# Patient Record
Sex: Female | Born: 1955 | Race: White | Hispanic: No | Marital: Married
Health system: Midwestern US, Academic
[De-identification: ages and names within clinical notes are randomized; demographics above are authoritative.]

---

## 2004-12-18 NOTE — Unmapped (Signed)
Signed by Georgeanna Harrison Mock on 12/18/2004 at 08:51:24    Phone Note   Call from Patient  Caller: patient  Department: RADIOLOGY  Call for: XRAY     Summary of Call:  LW [id number]    NEED XRAY'S PRINTED FROM 3.14.06 (LT KNEE) AND 12.8.06 (LUMBAR SPINE)   WILL PICK UP 7:30AM ON FRIDAY 7.21.06     Initial call taken by: Anders Simmonds,  December 18, 2004 8:51 AM    This department is not currently using the EMR system. Please see the paper chart for this patient for the response to this phone message.

## 2008-04-20 NOTE — Unmapped (Signed)
Signed by Roanna Raider MA on 04/20/2008 at 10:03:43    Printed Handout:  - Directions to [facility]

## 2008-04-20 NOTE — Unmapped (Signed)
Signed by Roanna Raider MA on 04/20/2008 at 10:03:43    Printed Handout:  - Clear Liquids Allowed the Day Before Surgery

## 2008-04-20 NOTE — Unmapped (Signed)
Signed by Roanna Raider MA on 04/20/2008 at 10:03:43    Printed Handout:  - HalfLytely Bowel Prep Kit

## 2008-04-20 NOTE — Unmapped (Signed)
Signed by Lurene Shadow MD on 04/20/2008 at 00:00:00  Referral      Imported By: Betsey Amen 05/07/2008 11:00:39    _____________________________________________________________________    External Attachment:    Please see [facility] EMR for this document.

## 2008-04-20 NOTE — Unmapped (Signed)
Signed by Roanna Raider MA on 04/20/2008 at 10:03:43    Phone Note   Patient Call    Call back at Home Phone: [phone]  Caller: patient  Department: IM -Gastroenterology  Call for: Pryor Montes    Follow-up for Phone Call   needs screening colo wants a monday.  NKDA, no latex allergy, on no blood thinners, BP or diabetes meds.  Will mail all instructions and RX.  Verbally reviewed instructions, and need for driver  Phone Call Completed, Called Patient  Follow-up by: Roanna Raider MA,  April 20, 2008 10:01 AM      Reason for Call: speak with nurse    Summary of Call:  pt needing colonoscopy.       Initial call taken by: Schuyler Amor,  April 20, 2008 9:33 AM

## 2008-04-20 NOTE — Unmapped (Signed)
Signed by Roanna Raider MA on 04/20/2008 at 10:07:43    [facility]  Division of Digestive Diseases        SURGERY / PROCEDURE SCHEDULE SHEET     Requested Date: 05/08/2008    Arrival Time: 6:30 am    Requested Time: 7:30 am    Length of Surgery: 30 minutes      Physician: Lurene Shadow MD    Facility: [facility]    Type of patient: Outside Referral    Anesthesia Type: IV Sedation    Medications:   Allergies: Allergies have not been documented for this patient    * Latex Sensitive: No    Procedure:     Procedure: Colonoscopy - Screening    Diagnoses: V76.51      Special Instructions: Halflytely bowel prep and clear liquids day prior, Instructions and Rx mailed to the patient.  Verbally reviewed allergy's NKDA, and on no blood thinners, BP or Diabetes meds    Patient Information:     Name: Theresa English    DOB: 02-Apr-1956    SSN: [id number]    Address: [address]  [place], [place]  [address]    Gender: Female    Home phone: [phone]    IDX #: [id number]    Last Word #: [id number]    Insurance Information:     Primary Insurance: Gaylyn Rong    Member ID #: [id number]

## 2008-04-21 NOTE — Unmapped (Signed)
Signed by Roanna Raider MA on 04/24/2008 at 16:02:22    PHONE NOTE - Outgoing Call      Call placed by: Roanna Raider MA,  April 21, 2008 2:54 PM  Call placed to: Patient  Summary of call: patient left message on VM needs to move 12/7 appt.  Called patient and had to leave VM to call us back      FOLLOW UP  Patient returning call.  Please call [phone]  Follow-up by: Graylon Gunning,  April 24, 2008 9:02 AM    ADDITIONAL FOLLOW UP  patient wants 12/10 now instead of 12/7, arrival time is now 730 for 830 procedure, patient received all the info in the mail today  phone call completed, patient called  Follow-up by: Roanna Raider MA,  April 24, 2008 4:00 PM

## 2008-05-08 ENCOUNTER — Inpatient Hospital Stay

## 2008-05-11 ENCOUNTER — Inpatient Hospital Stay

## 2008-05-11 NOTE — Unmapped (Signed)
Signed by Lurene Shadow MD on 05/11/2008 at 00:00:00  Colonoscopy      Imported By: Coletta Memos 05/22/2008 11:01:05    _____________________________________________________________________    External Attachment:    Please see [facility] EMR for this document.

## 2008-05-11 NOTE — Unmapped (Signed)
Signed by Lurene Shadow MD on 05/11/2008 at 00:00:00  Colon Photo      Imported By: Coletta Memos 05/22/2008 11:58:33    _____________________________________________________________________    External Attachment:    Please see [facility] EMR for this document.

## 2008-05-11 NOTE — Unmapped (Signed)
[facility]     PATIENT NAME:   Theresa English, Theresa English                   MR #:  [id number]   DATE OF BIRTH:  December 13, 1955                        ACCOUNT #:  [id number]   PHYSICIAN:      Lurene Shadow, M.D.          ROOM #:   SERVICE:        Internal Medicine/Digestive Disease NURSING UNIT:   DICTATED BY:    Lurene Shadow, M.D.          [facility]:  S   PROCEDURE DATE: 05/11/2008                        ADMIT DATE:  05/11/2008                                                     DISCHARGE DATE:                                       ENDOSCOPY       PROCEDURE(S) PERFORMED:     1.  Colonoscopy.     INDICATION:  Average-risk screening.     ANESTHESIA:  Sedation was administered using 8.5 mg of IV midazolam and 200   mcg of IV fentanyl.     DETAILS OF PROCEDURE:  A pediatric colonoscope was advanced from the anus as   far as the terminal ileum.  The procedure was quite difficult due to the   significant tortuosity of the patient's colon as well as her relative   tolerance for our IV sedatives.  However, we did complete the examination.   The bowel preparation was excellent.  There is no evidence of colitis,   ileitis, diverticulosis, neoplasia or vascular lesions.     IMPRESSION:     1.  Normal screening colonoscopy.  Would repeat in 10 years.                                                 _______________________________________   NS/kbc                                 _____   D:  05/11/2008 10:08                   Lurene Shadow, M.D.   T:  05/11/2008 17:11   Job #:  [id number]     c:   Coralee North, M.D.                                       ENDOSCOPY  Page    1 of   1   NS/kbc                                 _____   D:  05/11/2008 10:08                   Lurene Shadow, M.D.   T:  05/11/2008 17:11   Job #:  [id number]     c:   Coralee North, M.D.                                       ENDOSCOPY                                         Page    1 of   1

## 2008-05-11 NOTE — Unmapped (Signed)
Signed by   [employer] on 05/11/2008 at 17:26:11  Patient: [name]  Note: All result statuses are Final unless otherwise noted.    Tests: (1)  (MR)    Order Note:                             [facility]     PATIENT NAME:   Theresa English, Theresa English                   MR #:  [id number]  DATE OF BIRTH:  December 14, 1955                        ACCOUNT #:  [id number]  PHYSICIAN:      Lurene Shadow, M.D.          ROOM #:  SERVICE:        Internal Medicine/Digestive Disease NURSING UNIT:  DICTATED BY:    Lurene Shadow, M.D.          [facility]:  S  PROCEDURE DATE: 05/11/2008                        ADMIT DATE:  05/11/2008                                                    DISCHARGE DATE:                                       ENDOSCOPY        PROCEDURE(S) PERFORMED:     1.  Colonoscopy.     INDICATION:  Average-risk screening.     ANESTHESIA:  Sedation was administered using 8.5 mg of IV midazolam and 200  mcg of IV fentanyl.     DETAILS OF PROCEDURE:  A pediatric colonoscope was advanced from the anus as  far as the terminal ileum.  The procedure was quite difficult due to the  significant tortuosity of the patient's colon as well as her relative  tolerance for our IV sedatives.  However, we did complete the examination.  The bowel preparation was excellent.  There is no evidence of colitis,  ileitis, diverticulosis, neoplasia or vascular lesions.     IMPRESSION:     1.  Normal screening colonoscopy.  Would repeat in 10 years.                                                    _______________________________________  NS/kbc                                 _____  D:  05/11/2008 10:08                   Lurene Shadow, M.D.  T:  05/11/2008 17:11  Job #:  [id number]     c:   Coralee North, M.D.  ENDOSCOPY                                                               Page    1 of   1    Note: An exclamation mark (!) indicates a result that was not dispersed into   the  flowsheet.  Document Creation Date: 05/11/2008 5:26 PM  _______________________________________________________________________    (1) Order result status: Final  Collection or observation date-time: 05/11/2008 00:00  Requested date-time:   Receipt date-time:   Reported date-time:   Referring Physician:    Ordering Physician:  Reviewed In [facility])  Specimen Source:   Source: DBS  Filler Order Number: [id number]  Lab site:

## 2014-04-17 ENCOUNTER — Ambulatory Visit: Admit: 2014-04-17 | Payer: PRIVATE HEALTH INSURANCE

## 2014-04-17 DIAGNOSIS — G8929 Other chronic pain: Secondary | ICD-10-CM

## 2014-04-17 MED ORDER — meloxicam (MOBIC) 7.5 MG tablet
7.5 | ORAL_TABLET | Freq: Two times a day (BID) | ORAL | Status: AC | PRN
Start: 2014-04-17 — End: ?

## 2014-04-17 MED ORDER — tiZANidine (ZANAFLEX) 2 MG tablet
2 | ORAL_TABLET | Freq: Three times a day (TID) | ORAL | Status: AC | PRN
Start: 2014-04-17 — End: ?

## 2014-04-17 MED ORDER — DULoxetine (CYMBALTA) 60 MG capsule
60 | ORAL_CAPSULE | Freq: Every day | ORAL | Status: AC
Start: 2014-04-17 — End: ?

## 2014-04-17 NOTE — Unmapped (Signed)
Chief Complaint   Patient presents with   ??? Back Pain   ??? Hip Pain     right worse than left        History of Present Illness  HPI  Theresa English is a 58 y.o. female is referred here by Dr. Margaretmary Dys  for consultation, evaluation and treatment for both hip and back pain.  Patient states she's been dealing back pain for 10 years. She has been dealing hip pain for 9-10 years leading to replacement of the right hip 2008 and left hip in 2009. Her symptom were resolved after getting her hips replaced and started up again 3 years ago but has gotten significantly worse over the past year.   She plans to see an orthopod in the next week regarding hip pain.     Patient complains of hip pain bilateral R>L 4-9/10 VAS achy intermittent with fluctuating flare up of symptoms with radiation into the groin. Zanaflex improves symptoms. Activity makes symptoms worst.     Low back pain bilateral sharp intermittent 4-9/10 VAS non-referred. Rest and muscle relaxer improve symptoms. Post-exercise make symptoms worse.    Current medication: cymbalta 60mg  Qday, zanaflex 4mg   3 steroid injections this year  Past therapy: L5-S1 bilateral TFESI, trigger point injections with the last one occuring 2-3 months ago 3 total steroid injections this year, medial branch blocks, and radiofrequency ablation.      Zanaflex causes sedation.     Patient Complains of:  Uro-fecal Incontinence:   No  Weight Gain/Loss:  No  Fever/Chills:   No  Weakness:   No    Past Modalities:  TENS:    Yes  Physical Therapy:  Yes earlier this year  Chiropractor:   Yes  Massage Therapy:  Yes  Psychotherapy:  No    Previous Injection (%): see above  Effect of Injection (%):    Length of Relief:      Past Medications:  Celebrex, tramadol,hydrocodone     The following portions of the patient's history were reviewed and updated as appropriate: allergies, current medications, past family history, past medical history, past social history, past surgical history and  problem list.    Histories  She has a past medical history of Hypertension; MVP (mitral valve prolapse); and Raynaud disease.    She has past surgical history that includes Total hip arthroplasty.    Her family history includes Cancer in her mother and sister; Coronary artery disease in her mother; Diabetes in her sister.    She reports that she has never smoked. She does not have any smokeless tobacco history on file. She reports that she drinks alcohol. She reports that she does not use illicit drugs.    Allergies  Review of patient's allergies indicates no known allergies.    Medications  Outpatient Encounter Prescriptions as of 04/17/2014   Medication Sig Dispense Refill   ??? DULoxetine (CYMBALTA) 60 MG capsule Take 1 capsule (60 mg total) by mouth daily. 30 capsule 0   ??? LORazepam (ATIVAN) 0.5 MG tablet Take 0.5 mg by mouth 2 times a day as needed for Anxiety.     ??? metoprolol tartrate (LOPRESSOR) 50 MG tablet Take 50 mg by mouth daily.     ??? tiZANidine (ZANAFLEX) 4 MG capsule Take 4 mg by mouth 3 times a day.     ??? zolpidem (AMBIEN CR) 6.25 MG Take 6.25 mg by mouth at bedtime as needed for Sleep.     ??? [DISCONTINUED] DULoxetine (  CYMBALTA) 60 MG capsule Take 60 mg by mouth daily.     ??? meloxicam (MOBIC) 7.5 MG tablet Take 1 tablet (7.5 mg total) by mouth 2 times a day as needed for Pain. Indications: Osteoarthritis 60 tablet 0   ??? tiZANidine (ZANAFLEX) 2 MG tablet Take 0.5-1 tablets (1-2 mg total) by mouth every 8 hours as needed. 90 tablet 0     No facility-administered encounter medications on file as of 04/17/2014.        Review of Systems   Musculoskeletal: Positive for back pain and arthralgias.   All other systems reviewed and are negative.      Vitals  Blood pressure 143/83, pulse 66, temperature 97.1 ??F (36.2 ??C), temperature source Oral, resp. rate 14, height 4' 8 (1.422 m), weight 139 lb (63.05 kg).    Physical Exam   Vitals reviewed.  Constitutional: She is oriented to person, place, and time. She  appears well-developed and well-nourished.   HENT:   Head: Normocephalic.   Eyes: EOM are normal.   Cardiovascular: Regular rhythm.    Pulmonary/Chest: Breath sounds normal. No respiratory distress. She has no wheezes. She has no rales.   Abdominal: Soft. She exhibits no distension. There is no tenderness. There is no rebound and no guarding.   Musculoskeletal:        Right hip: She exhibits tenderness. She exhibits normal range of motion and normal strength.        Left hip: She exhibits tenderness. She exhibits normal range of motion and normal strength.        Lumbar back: She exhibits tenderness, bony tenderness, pain and spasm. She exhibits normal range of motion.        Back:    Negative patrick's sign, negative facet loading, negative modified straight leg test bilaterally,  Pain with abduction of the right hip  5/5 strength of the bilateral lower extremities   Neurological: She is alert and oriented to person, place, and time. No cranial nerve deficit.   Skin: Skin is warm and dry.   Psychiatric: She has a normal mood and affect. Her behavior is normal. Judgment and thought content normal.      Neurologic Exam     Mental Status   Oriented to person, place, and time.     Cranial Nerves     CN III, IV, VI   Extraocular motions are normal.      Ortho Exam    Activities of Daily Living:  1. Activity Level - patient indicates as   and performs independently.  2. Sleep Patterns - patient indicates as   and performs independently.                                 - indicates an average of   hours of sleep per night.  3. Motivation - patient indicates as   and performs independently.  4. Functional Level - patient indicates as   and performs independently.           (a) Bathing - patient indicates as   and performs independently.           (b) Dressing - patient indicates as   and performs independently.           (c) Transferring from bed/chair - patient indicates as   and performs independently.           (d)  Walking - patient  indicates as   and performs independently.           (e) Eating - patient indicates as   and performs independently.           (f) Toilet Use - patient indicates as   and performs independently.           (g) Personal Hygiene - patient indicates as   and performs independently.  Notes:      Review of Lab Results  No results found for: WBC, RBC, HGB, HCT, MCV, MCH, MCHC, RDW, PLT, MPV, MG, BUN, INR, PTT    Investigations Reviewed:   05/17/13 MRI lumbar: Left lateral disc extruded fragment effaced the left S1 nerve root and to a lesser degress the left L5 nerve root at the level of L5/S1    Disc bulge with right lateral component effaced exiting right L4 nerve root and right greater than left L5 nerve root at the level of L4/L5.    Rx Reporting Review:  Date of last UDS:  04/17/2014   Comments:  pending  Date of last OARRS review: 04/17/2014   Comments: consistent     Pain Flowsheet (Urine Drug/OARRS-eKASPER/Narcotic) 04/17/2014 04/17/2014   OARRS/eKASPER Consistent with Prescriber Expectation Y Y   OARRS/eKASPER Status Reviewed Reviewed     I have completed the required OARRS documentation for this patient on 04/17/2014.  ARION ALEXANDER SMALLEY    Chronic Assessment Tools:  PEG Total Score: 25  ORT Total Score (FEMALE): 1  ORT Risk Category (Female): Low  SOAPP Total Score: 3  SOAPP Indication: Negative     ASSESSMENT:  1. Myofacial pain   2. Bilateral chronic hip pain  3. Lumbago without radiculopathy  4. Lumbar DDD    PLAN:  1. Discussed with the patient regarding the etiology of their pain. Informed them that they would likely benefit from a bilateral trigger point injections of the paraspinous muscles at L3, L4, L5. The procedure was described in detail and the risks, benefits and alternatives were discussed with the patient (including but not limited to: bleeding, infection, nerve damage, worsening of pain, inability to perform injection, paralysis, seizures, and death) who agreed to proceed.     2. Continue cymbalta 60mg  Qday (#30, 0 refills).   3. Decrease Zanaflex from 4 to 2 mg (#90, 0 refills).   4. Aquatic therapy for hips and low back pain  5. Start Mobic 7.5mg  BID prn pain (#60, 0 refills). Patient informed of increased risk of heart attacks, stroke and kidney problems in addition to gastric ulcers with use of non-steroidal anti-inflammatory medications.   6. Please be careful driving until you know how the medication changes affect you.   7. Follow up 4 weeks    Domenic Polite MD  Pain Fellow         ATTENDING NOTE:    I have supervised the fellow in this assessment, reviewed their note and discussed care with them.  I personally saw the patient and I agree with their history, physical examination, assessment and plan as noted above.     Theresa English is a 58 y.o. female is referred here by Dr. Margaretmary Dys for consultation, evaluation and treatment for the chief complaint of both hip and low back pain.     Discussed with the patient that they would likely benefit from a multi-modal pain medication treatment plan focusing on function and pain relief.    Referral to aquatic therapy for the hip and low  back pain.    Discussed with the patient regarding the etiology of their pain. Informed them that they would likely benefit from a bilateral trigger point injections into the bilateral lumbar paraspinal muscles at L3, L4 and L5.  The procedure was described in detail and the risks, benefits and alternatives were discussed with the patient (including but not limited to: bleeding, infection, nerve damage, worsening of pain, CSF leak, inability to perform injection, paralysis, seizures, and death) who agreed to proceed.   Procedure performed today for the convenience of the patient so that they wouldn't have to return for the injection at a later date as they wished to have the procedure done ASAP due to their pain.     Will start Mobic 7.5 mg by mouth two times per day as needed for pain with food  for inflammatory component of pain.   Will decrease Tizanidine to 2 mg for myofascial pain as needed.  Patient instructed regarding side effects and the need to avoid driving until they know how the medication changes affect them.   Will continue Cymbalta at this time.      Patient is to return to clinic for a follow up visit in 4 weeks.     Patient seen by Lowella Petties. Alveda Reasons, MD (Pain Medicine Attending)

## 2014-04-17 NOTE — Unmapped (Signed)
1. Discussed with the patient regarding the etiology of their pain. Informed them that they would likely benefit from a bilateral trigger point injections of the paraspinous muscles at L3, L4, L5. The procedure was described in detail and the risks, benefits and alternatives were discussed with the patient (including but not limited to: bleeding, infection, nerve damage, worsening of pain, inability to perform injection, paralysis, seizures, and death) who agreed to proceed.    2. Continue cymbalta 60mg  Qday (#30, 0 refills).   3. Decrease Zanaflex from 4 to 2 mg (#90, 0 refills).   4. Aquatic therapy for hips and low back pain  5. Start Mobic 7.5mg  BID prn pain (#60, 0 refills). Patient informed of increased risk of heart attacks, stroke and kidney problems in addition to gastric ulcers with use of non-steroidal anti-inflammatory medications.   6. Please be careful driving until you know how the medication changes affect you.   7. Follow up 4 weeks

## 2014-04-26 DIAGNOSIS — G8929 Other chronic pain: Secondary | ICD-10-CM

## 2014-04-26 NOTE — Unmapped (Addendum)
Procedure Performed:  Trigger Point Injections into Bilateral Lumbar Paraspinal Muscles at L3, L4 and L5    Preoperative Diagnosis:  Myofascial Pain    Postoperative Diagnosis:  Same    Attending Surgeon:  Kathe Mariner MD    Surgeon:  Kathe Mariner MD    Assistant Surgeon:  Dr. Frann Rider    Anesthesia:  Local    Specimen Removed:  None    Complications:  None    Pre-Procedure Vital Signs  Filed Vitals:    04/17/14 1308   BP: 143/83   Pulse: 66   Temp: 97.1 ??F (36.2 ??C)   Resp: 14       Procedure in Detail:  The patient's chart was reviewed. After obtaining written informed consent, the patient was placed in the sitting position. The area was prepped and draped in the usual sterile fashion using antiseptic solution.  A formal time out was performed identifying the correct patient, correct procedure, reviewing anticoagulation status, reviewing allergies and verifying the correct sites and side.      Trigger Points Identified:  Bilateral Lumbar Paraspinal Muscles at L3, L4 and L5    # of points identified:  6 total (1 at each site on each side)  Dry Needling:  Used to elicit hyperactivity at the sites    Then, a 25-gauge 1 1/2 inch needle was inserted into each point after sterile prep.  We were able to elicite hyperactivity within the points. A total mixture of 10 ml of 0.25% marcaine mixed with 40 mg of Kenalog was injected slowly after negative aspiration in equal divided doses at the 6 sites.  The patient tolerated the procedure well. The needle was flushed and withdrawn, and a Band-Aid was applied.      Post-Procedure Vitals  VSS    Disposition:  The patient was given written discharge instructions. The patient will return to clinic in 4 weeks for repeat of the injection.

## 2014-05-15 ENCOUNTER — Ambulatory Visit: Admit: 2014-05-15 | Payer: PRIVATE HEALTH INSURANCE

## 2014-05-15 DIAGNOSIS — M7918 Myalgia, other site: Secondary | ICD-10-CM

## 2014-05-15 NOTE — Unmapped (Signed)
Procedure Performed:  Trigger Point Injections into Right Vastus Intermedius and Rectus Femoris    Preoperative Diagnosis:  Myofascial Pain    Postoperative Diagnosis:  Same    Attending Surgeon:  Kathe Mariner MD    Surgeon:  Kathe Mariner MD    Assistant Surgeon:  None    Anesthesia:  Local    Specimen Removed:  None    Complications:  None    Pre-Procedure Vital Signs  Filed Vitals:    05/15/14 1337   BP: 131/70   Pulse: 85   Resp: 14       Procedure in Detail:  The patient's chart was reviewed. After obtaining written informed consent, the patient was placed in the supine position. The area was prepped and draped in the usual sterile fashion using antiseptic solution.  A formal time out was performed identifying the correct patient, correct procedure, reviewing anticoagulation status, reviewing allergies and verifying the correct sites and side.      Trigger Points Identified:  Rectus fermoris:  Right  Vastus intermedius:  Right    # of points identified:  2 total (1 in each muscle)  Dry Needling:  Used to elicit hyperactivity at the sites    Then, a 25-gauge 1 1/2 inch needle was inserted into each point after sterile prep.  We were able to elicite hyperactivity within the points. A total mixture of 10 ml of 0.25% marcaine mixed with 40 mg of kenalog was injected slowly after negative aspiration with 5 ml solution was injected into each point.      The patient tolerated the procedure well. The needle was flushed and withdrawn, and a Band-Aid was applied.           Post-Procedure Vitals  Post-Procedure Pulse: 76   Post-Procedure BP:  128/71    Disposition:  The patient was given written discharge instructions.

## 2014-05-24 ENCOUNTER — Ambulatory Visit: Admit: 2014-05-24 | Payer: PRIVATE HEALTH INSURANCE

## 2014-05-24 NOTE — Unmapped (Signed)
Agree with needing to be seen for correct and proper documentation.  She did not wish to be seen today.  She can make a follow up visit as needed in the future to discuss continuation of her care plan.

## 2014-05-24 NOTE — Unmapped (Signed)
Per office note from staff:    Patient was very confused about her OV today. She said that was for an injection. This appointment today was made at her initial OV 11/16, and she had TPI then and 12/14. We had authorization for two. Which she has had in completion effective 12/14. Dian Queen came to get her and she was adamant that she would not pay just to talk. Clydie Braun had to walk away because she got frustrated with patient. Debarah Crape said that we would needed to authorize for more injections.. Hip and back... Etc. She lives in [place] and this is not how they do it anywhere else?? SHe has an order from a [facility] dr to have injections., She was told again.. That Dr Juanda Bond would need documentation and OV to submit for any more injections to be completed. She stormed off.     Based upon this, she can reschedule as needed in the future to be evaluated as needed in the future.    Lowella Petties. Alveda Reasons, MD (Pain Attending)

## 2014-05-24 NOTE — Unmapped (Signed)
Georgette Dover, MA  Janie Morning, RN; Kathe Mariner, MD ??    ?? Cc: Lillie Fragmin, MA ??   ??    ??    ??   ?? Patient was very confused about her OV today. She said that was for an injection. This appointment today was made at her initial OV 11/16, and she had TPI then and 12/14. We had authorization for two. Which she has had in completion effective 12/14. Dian Queen came to get her and she was adamant that she would not pay just to talk. Clydie Braun had to walk away because she got frustrated with patient. Debarah Crape said that we would needed to authorize for more injections.. Hip and back... Etc. She lives in [place] and this is not how they do it anywhere else?? SHe has an order from a [facility] dr to have injections., She was told again.. That Dr Juanda Bond would need documentation and OV to submit for any more injections to be completed. She stormed off.    ??   ??

## 2015-03-30 ENCOUNTER — Ambulatory Visit: Admit: 2015-03-30 | Discharge: 2015-03-30 | Payer: PRIVATE HEALTH INSURANCE

## 2015-03-30 ENCOUNTER — Inpatient Hospital Stay: Admit: 2015-03-30 | Payer: PRIVATE HEALTH INSURANCE

## 2015-03-30 DIAGNOSIS — M25562 Pain in left knee: Secondary | ICD-10-CM

## 2015-03-30 DIAGNOSIS — M1712 Unilateral primary osteoarthritis, left knee: Secondary | ICD-10-CM

## 2015-03-30 MED ORDER — triamcinolone acetonide (KENALOG-40) injection 40 mg
40 | Freq: Once | INTRAMUSCULAR | Status: AC
Start: 2015-03-30 — End: 2015-03-30
  Administered 2015-03-30: 18:00:00 40 mg

## 2015-03-30 MED ORDER — lidocaine 10 mg/mL (1 %) injection 4 mL
10 | Freq: Once | INTRAMUSCULAR | Status: AC
Start: 2015-03-30 — End: 2015-03-30
  Administered 2015-03-30: 18:00:00 4 mL via SUBCUTANEOUS

## 2015-03-30 NOTE — Unmapped (Signed)
This office note has been dictated.    A timeout was performed where all medications and allergies were reviewed, and the procedure was explained to the patient. Laterality was verified.    A verbal consent was obtained and a verbal timeout was performed on 03/30/2015 at 12:15 PM.      LINDSAY TOLES, MA

## 2015-03-30 NOTE — Unmapped (Signed)
[facility]     PATIENT NAME:  Theresa English, Theresa English                   MRN:  [id number]  DATE OF BIRTH:  June 23, 1955                       CSN:  [id number]  PROVIDER:  Santiago Glad, M.D.                VISIT DATE:  03/30/2015                                       OFFICE NOTE     ADDENDUM - 03/30/2015 - SAR     The patient comes in today for evaluation and treatment.  Please see Marcelino Duster  O'Brien's note for details.     The patient has had left knee pain intermittently for one year.  She had no  pain with walking.  Has pain getting up from a chair.  No specific injury.  NSAIDs did not help.  She has tried ice, heat.     Past medical history, medications, allergies, hospitalizations, social  history, family history and review of systems have been documented on the  intake forms today, signed by me and will not be redictated.     PHYSICAL EXAM:  Well-developed, well-nourished female in no acute distress, 5  feet 6 inches tall, 135 pounds.  Full range of motion of her knee.  She has  pain with grind.  Pain with loaded straight leg raise.  Neurovascularly  intact.     X-rays show tricompartmental degenerative changes, calcifications of her  menisci and significant patellofemoral degenerative change.     IMPRESSION:  Left knee arthritis, calcium pyrophosphate disease.     PLAN:  I have recommended cortisone shot.  A handout, I gave her some  information about my website.  Get her into some therapy, do some gentle  strengthening.  I will see the patient back in the office in a month.                                              Santiago Glad, M.D.  BRB/sar  D:  03/30/2015 12:20  T:  04/02/2015 11:33  Job #:  [id number]           OFFICE NOTE                                                  PAGE    1 of   1

## 2015-03-30 NOTE — Unmapped (Signed)
[facility]  [place] [facility]    ORTHOPEDIC SURGERY AND SPORTS MEDICINE    ANTONINA DEZIEL  27-May-1956    Date of Visit:   03/30/2015    Chief Complaint: Left Knee Pain    History:  Theresa English is a 59 y.o. female left knee pain. She has had L knee pain for 1 year. It was intermittent but seems to be becoming more constant recently. She has not injured this knee in the past that she knows of. She tried NSAIDs and heat/ice with no relief. She is a relatively active person and would like to continue this. She doesn't have pain with walking, more with getting up out of a chair or with stairs. She complains of anterior knee pain.    Past Medical History:  Past Medical History   Diagnosis Date   ??? Hypertension    ??? MVP (mitral valve prolapse)    ??? Raynaud disease        Past Surgical History:  Past Surgical History   Procedure Laterality Date   ??? Total hip arthroplasty       bilateral   ??? Pr inject trigger points, > 3  04/26/2014         ??? Pr inject trigger point, 1 or 2  05/15/2014       Allergies:  No Known Allergies    Medications:  Current Outpatient Prescriptions   Medication Sig   ??? DULoxetine Take 1 capsule (60 mg total) by mouth daily.   ??? LORazepam Take 0.5 mg by mouth 2 times a day as needed for Anxiety.   ??? meloxicam Take 1 tablet (7.5 mg total) by mouth 2 times a day as needed for Pain. Indications: Osteoarthritis   ??? metoprolol tartrate Take 50 mg by mouth daily.   ??? tiZANidine Take 0.5-1 tablets (1-2 mg total) by mouth every 8 hours as needed.   ??? tiZANidine Take 4 mg by mouth 3 times a day.   ??? zolpidem Take 6.25 mg by mouth at bedtime as needed for Sleep.     No current facility-administered medications for this visit.       Social History:  Social History     Social History   ??? Marital Status: Married     Spouse Name: N/A   ??? Number of Children: N/A   ??? Years of Education: N/A     Occupational History   ??? Not on file.     Social History Main Topics   ??? Smoking status: Never Smoker    ??? Smokeless  tobacco: Not on file   ??? Alcohol Use: Yes   ??? Drug Use: No   ??? Sexual Activity: Not on file     Other Topics Concern   ??? Seat Belt Yes     Social History Narrative       Review of systems:  A complete review of systems was performed today per the intake form, which is recorded in EPIC.  This was personally reviewed by me and pertinent positives are noted above in the history.    Physical Exam:  General:  Patient appears  stated age and is in no acute distress.  Neuro/Psych:  Patient is alert and oriented times 3 with an appropriate mood and affect.  Skin:  Examination of the patient???s skin shows no rashes, ecchymosis, or abrasions.    Back: No tenderness to palpation over the midline.  Full range of motion in flexion, extension, rotation, & lateral  bend without pain.  Negative straight leg raise test.    Right Knee:  ??? Palpation: ttp over sup pole of patella, pain on deep flexion  ??? Range of motion:  0-140 of flexion   ??? Swelling: none  ??? The knee is stable  to varus & valgus stress  ??? negative Lachman???s test  ??? negative posterior drawer test  ??? negative McMurray???s test  ??? negative Patellar apprehension test  ??? Neurovascularly intact to gross inspection distally.  ??? Pain with patellar compression        Films:   3 V R knee show CPPD deposits in the menisci, well maintained lateral and medial compartments, distal femur osteophyte formation and anterior compatment narrowing with patellar spurring    Assessment:   R knee OA    Plan:   -Will start gentle PT for anterior knee pain  -Rec cortisone injection today  RTC PRN      Through the position of the anterolateral portal, under sterile  conditions I injected the affected knee with 1 mL of 40 mg/mL of Kenalog and 4 mL of  lidocaine plain. The patient tolerated the procedure well without complications.      Clarene Essex, MD

## 2015-04-02 ENCOUNTER — Encounter: Payer: PRIVATE HEALTH INSURANCE | Attending: Sports Medicine

## 2015-04-13 ENCOUNTER — Encounter: Payer: PRIVATE HEALTH INSURANCE | Attending: Sports Medicine

## 2015-04-17 ENCOUNTER — Encounter: Payer: PRIVATE HEALTH INSURANCE | Attending: Sports Medicine

## 2015-05-02 ENCOUNTER — Encounter: Payer: PRIVATE HEALTH INSURANCE | Attending: Foot & Ankle Surgery

## 2015-05-09 ENCOUNTER — Encounter: Payer: PRIVATE HEALTH INSURANCE | Attending: Foot & Ankle Surgery

## 2017-03-18 ENCOUNTER — Ambulatory Visit: Admit: 2017-03-18 | Discharge: 2017-03-18 | Payer: PRIVATE HEALTH INSURANCE | Attending: Foot & Ankle Surgery

## 2017-03-18 DIAGNOSIS — M19072 Primary osteoarthritis, left ankle and foot: Secondary | ICD-10-CM

## 2017-03-18 NOTE — Progress Notes (Signed)
New patient being seen for a 6 week follow up of implant placement.    [facility]KLE SURGERY CLINIC NOTE  03/18/2017 1:57 PM                                 Theresa English                   @RRHLOS @         @RRDAYSPOSTSURGERY @    Patient presents today status post implant arthorplasty left hallux progressing well relates minimal to moderate postoperative pain well-controlled with analgesics.  Patient denies nausea vomiting fever diarrhea chills shortness of breath. Relates she has not been doing very well with scar massage and ROM exercises but will improve on this.      Patient relates they are feeling generally well recently.  neg Nausea/Vomitus   Bowel/Bladder habit changes   Painful joints/muscles   Vision/Taste/Hearing disturbances   SOB/Cough/Breathing changes   Fever/Chills      Active Problems:    * No active hospital problems. *    Past Medical History:   Diagnosis Date    Hypertension     MVP (mitral valve prolapse)     Raynaud disease      Past Surgical History:   Procedure Laterality Date    PR INJECT TRIGGER POINT, 1 OR 2  05/15/2014    PR INJECT TRIGGER POINTS, > 3  04/26/2014         TOTAL HIP ARTHROPLASTY      bilateral     Family History   Problem Relation Age of Onset    Coronary artery disease Mother     Cancer Mother     Cancer Sister     Diabetes Sister      Social History   Substance Use Topics    Smoking status: Never Smoker    Smokeless tobacco: Never Used    Alcohol use Yes     No Known Allergies      OBJECTIVE:  Blood pressure 142/86, pulse 65, height 5' 6 (1.676 m), weight 144 lb 9.6 oz (65.6 kg), SpO2 98 %.  Incision site(s) well coapted left foot Steri-Strips in place.  Gross deformity appears to be well corrected  Affected digits capillary fill time brisk.  Negative for significant erythema or calor.  Mild localized edema.      Imaging:  Radiographs left foot  3 view show good correction of deformities with good bony apposition and position.    Assessment:  #1.  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        Treatment:  #1.  E./M.within the global postoperative time frame from another surgeon with discussion regarding weightbearing transitions and methods of provide protection and offloading for patient's surgical sites.  Discussed postoperative swelling and pain expectations and we agreed upon current regimen.  #2.  Radiographs 3 views left reviewed as above.  #3.  Redressed with dry sterile dressing  Reappoint 4-6weeks or sooner if necessary.

## 2020-02-22 ENCOUNTER — Ambulatory Visit: Payer: PRIVATE HEALTH INSURANCE | Attending: Family

## 2020-07-05 IMAGING — MR MRI LUMBAR SPINE WITHOUT CONTRAST
4 of 6 series · 23 of 48 positions shown · IV contrast (gadolinium)
Comparison: None

MRI LUMBAR SPINE WITHOUT CONTRAST, 07/05/2020 [DATE]: 
CLINICAL INDICATION: Patient states pain across low back and into left buttock 
for 10 years. No pain down left leg.
TECHNIQUE: Multiplanar, multiecho position MR images of the lumbar spine were 
performed without intravenous gadolinium enhancement.

[Series 101: survey · axial · 10.0mm · 1.39mm/px · z∈[+15,+199]mm · 3 of 14 slices shown]
[im 3/14]
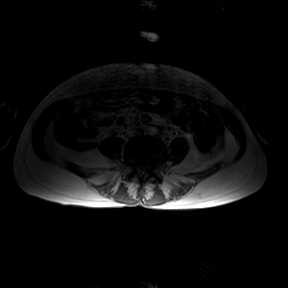
[im 8/14]
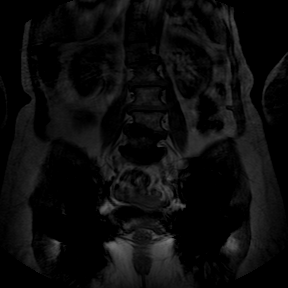
[im 14/14]
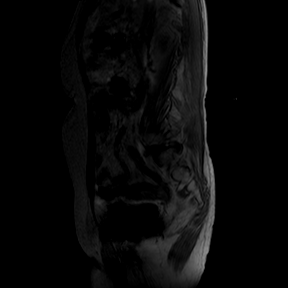

[Series 301: t1w tse sag · sagittal · 4.0mm · 0.50mm/px · 3 of 17 slices shown]
[im 4/17]
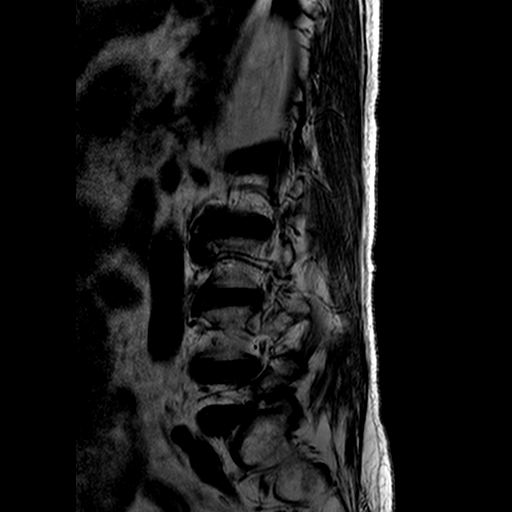
[im 10/17]
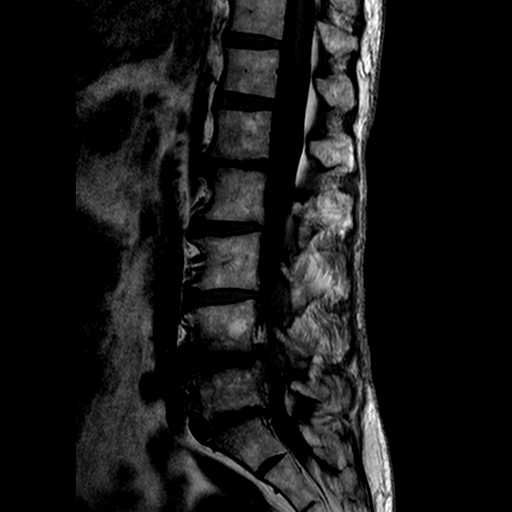
[im 17/17]
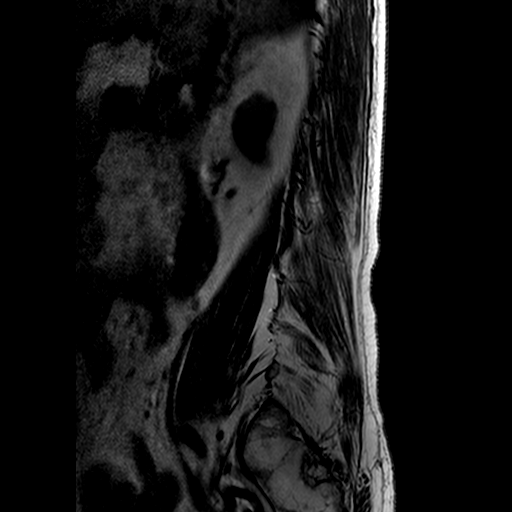

[Series 601: T2 · axial · 4.0mm · 0.47mm/px · z∈[-31,+160]mm · 11 of 30 slices shown]
[im 1/30]
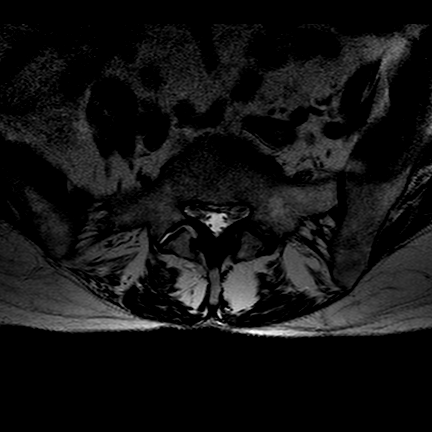
[im 3/30]
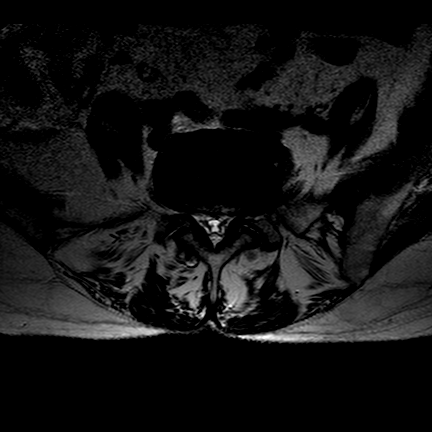
[im 6/30]
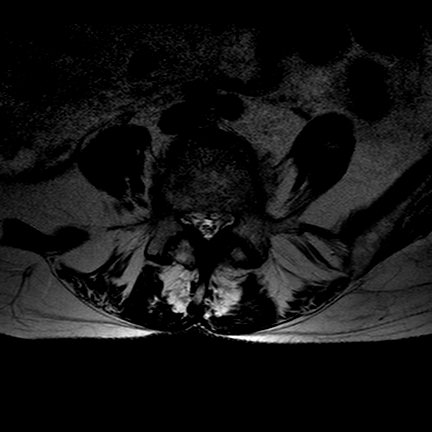
[im 9/30]
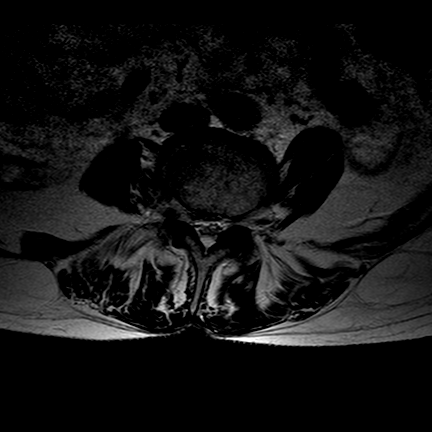
[im 12/30]
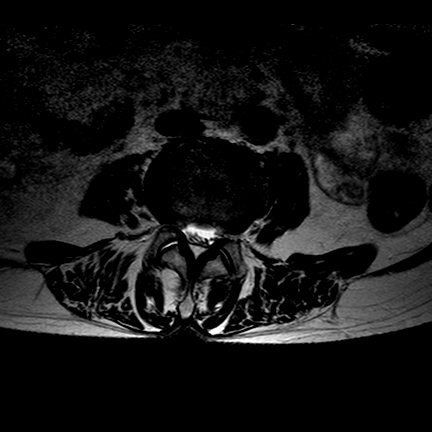
[im 15/30]
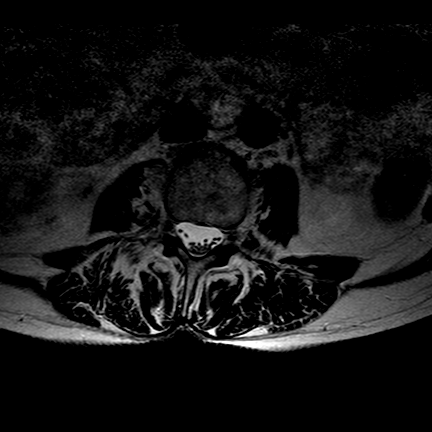
[im 18/30]
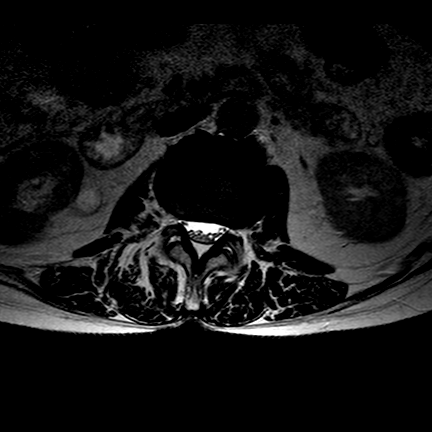
[im 21/30]
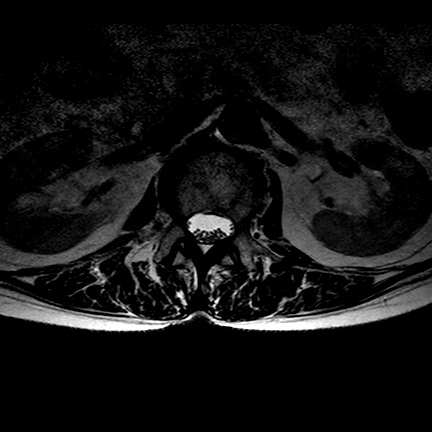
[im 24/30]
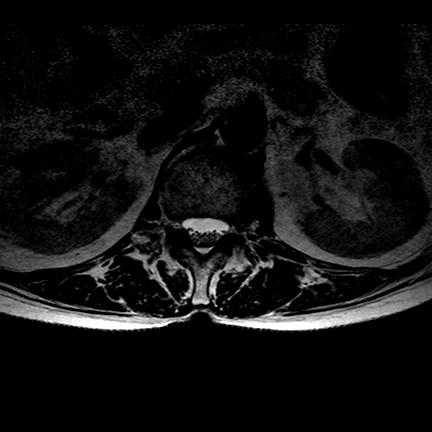
[im 27/30]
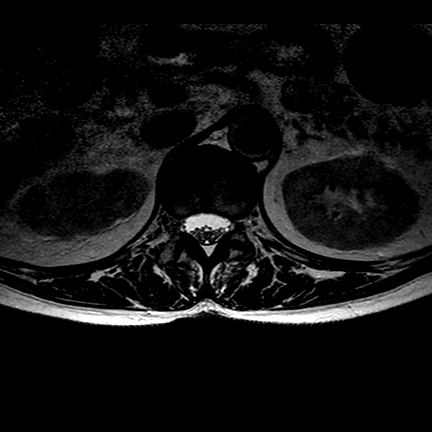
[im 30/30]
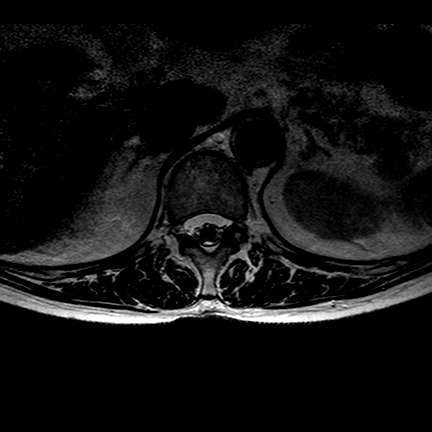

[Series 701: T1 · axial · 4.0mm · 0.35mm/px · z∈[-33,+106]mm · 6 of 35 slices shown]
[im 1/35]
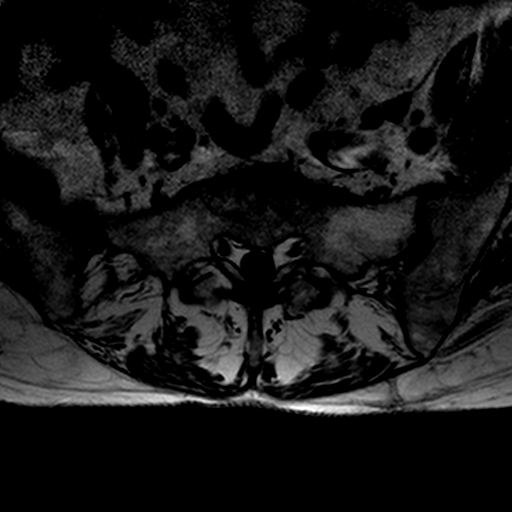
[im 6/35]
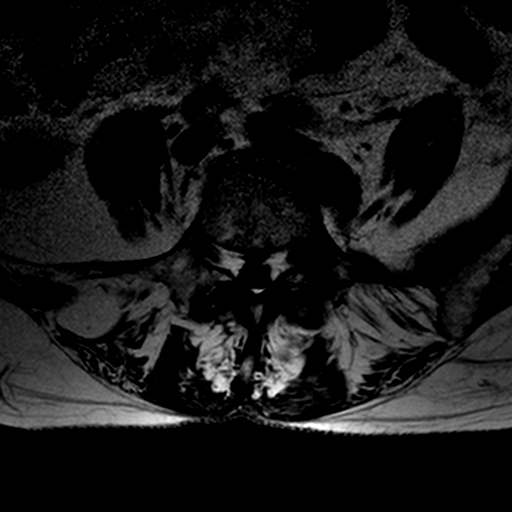
[im 12/35]
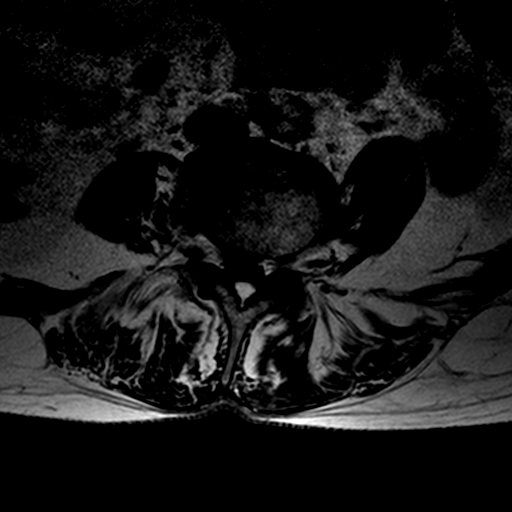
[im 15/35]
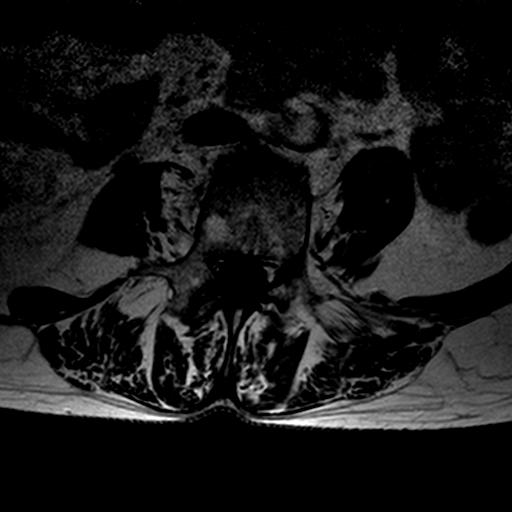
[im 18/35]
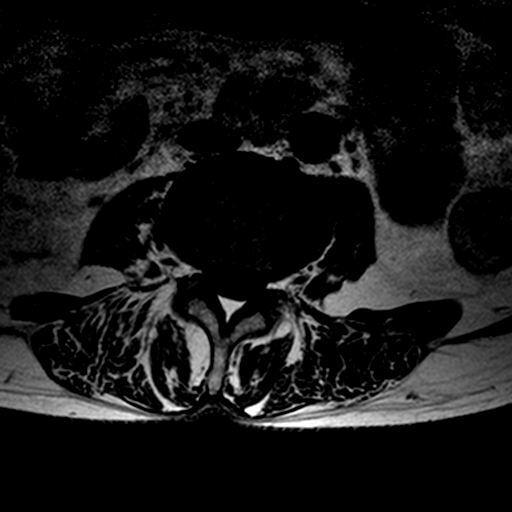
[im 29/35]
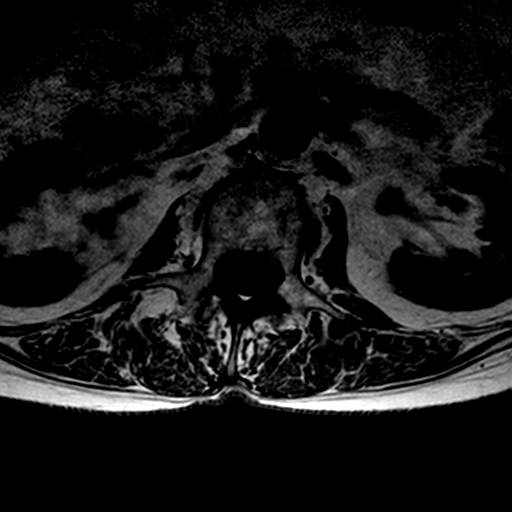

[23 of 48 positions shown; findings below may reference images not displayed]

FINDINGS: Nomenclature is based on 5 lumbar type vertebral bodies.  There are 
overall short pedicle lengths contributing to overall congenital spinal canal 
stenosis. There is mild lower lumbar levocurvature. No vertebral body fracture. 
No spondylolisthesis. There are Modic type I discogenic marrow signal endplate 
changes at L4-L5. No abnormal marrow signal intensity. No pars defect.  The 
conus tip terminates at the L1 vertebral body level. Paraspinal musculature is 
symmetric. On the included initial survey series there is metallic 
susceptibility artifact about both hips suggestive for bilateral Nely. 
T12-L1: Mild disc desiccation without disc height loss. No disc herniation. 
Normal facets. No spinal canal or neural foraminal stenosis. 
L1-L2: Moderate disc desiccation and ventral disc height loss. Mild annular 
bulge. Mild facet ligamentum flavum hypertrophy. No spinal canal or neural 
foraminal stenosis. 
L2-L3: Moderate disc desiccation and dorsal disc height loss. Mild dorsal disc 
osteophyte ridging. Mild facet and ligamentum flavum hypertrophy. The thecal sac 
is narrowed to 8 mm AP. 
L3-L4: Moderate disc desiccation mild ventral disc height loss. Mild broad-based 
disc protrusion. Mild facet ligamentum flavum hypertrophy. Mild right greater 
than left lateral recess stenosis. Moderate spinal canal stenosis with the 
thecal sac narrowed to 6 mm AP. 
L4-L5: Moderate disc desiccation and disc height loss. Mild broad-based disc 
protrusion. There is severe spinal calcinosis with the thecal sac narrowed to 2 
mm AP. There is effacement of CSF about the traversing cauda equina. Mild 
bilateral neural foraminal stenosis. There is buckling of the cauda equina nerve 
roots proximal to this level. 
L5-S1: Moderate disc desiccation and disc height loss. Mild dorsal disc 
osteophyte complex. This is slightly eccentric to the left with abutment of the 
foraminal-extraforaminal exiting left L5 nerve root. Mild facet hypertrophy. 
Moderate bilateral neural foraminal stenoses and lateral recess stenoses. 
Moderate to severe spinal canal stenosis with the thecal sac narrowed to 4 to 5 
mm AP. Mild facet ligamentum flavum hypertrophy.
IMPRESSION: 1.  Levocurvature with moderately advanced spondylotic changes superimposed upon 
a congenitally narrowed spinal canal. 
2.  Multilevel spinal canal stenoses most advanced at L4-L5. 
3.  Multilevel neural foraminal stenoses and lateral recess stenoses.

## 2020-08-28 IMAGING — DX LUMBAR SPINE COMPLETE 4 VIEWS
1 series · 4 of 4 positions shown · non-contrast
Comparison: Lumbar MRI July 05, 2020

LUMBAR SPINE COMPLETE 4 VIEWS, 08/28/2020 [DATE]: 
CLINICAL INDICATION:  Spinal stenosis

[Series 1: AP · 0.14mm/px · 4 of 4 slices shown]
[im 1/4]
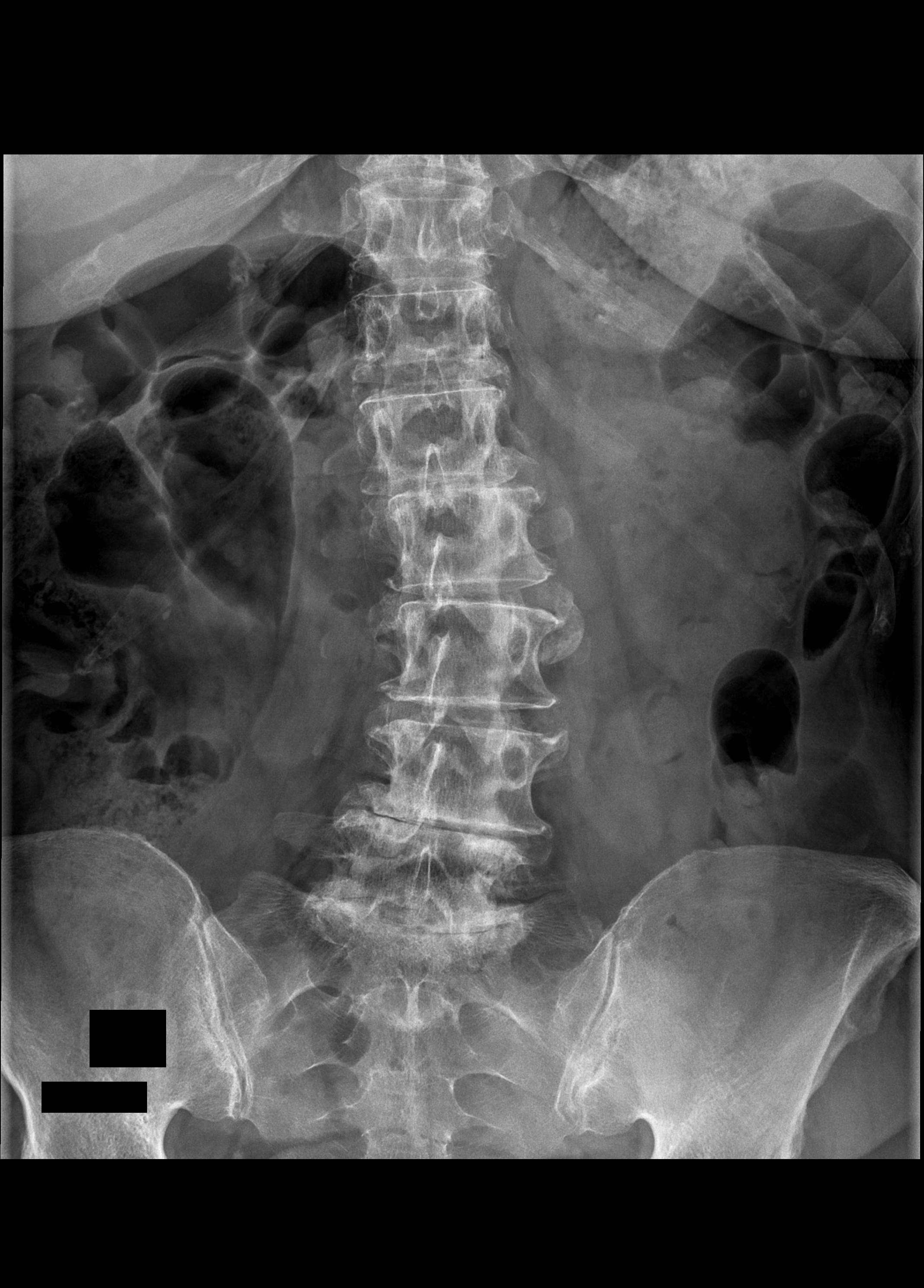
[im 2/4]
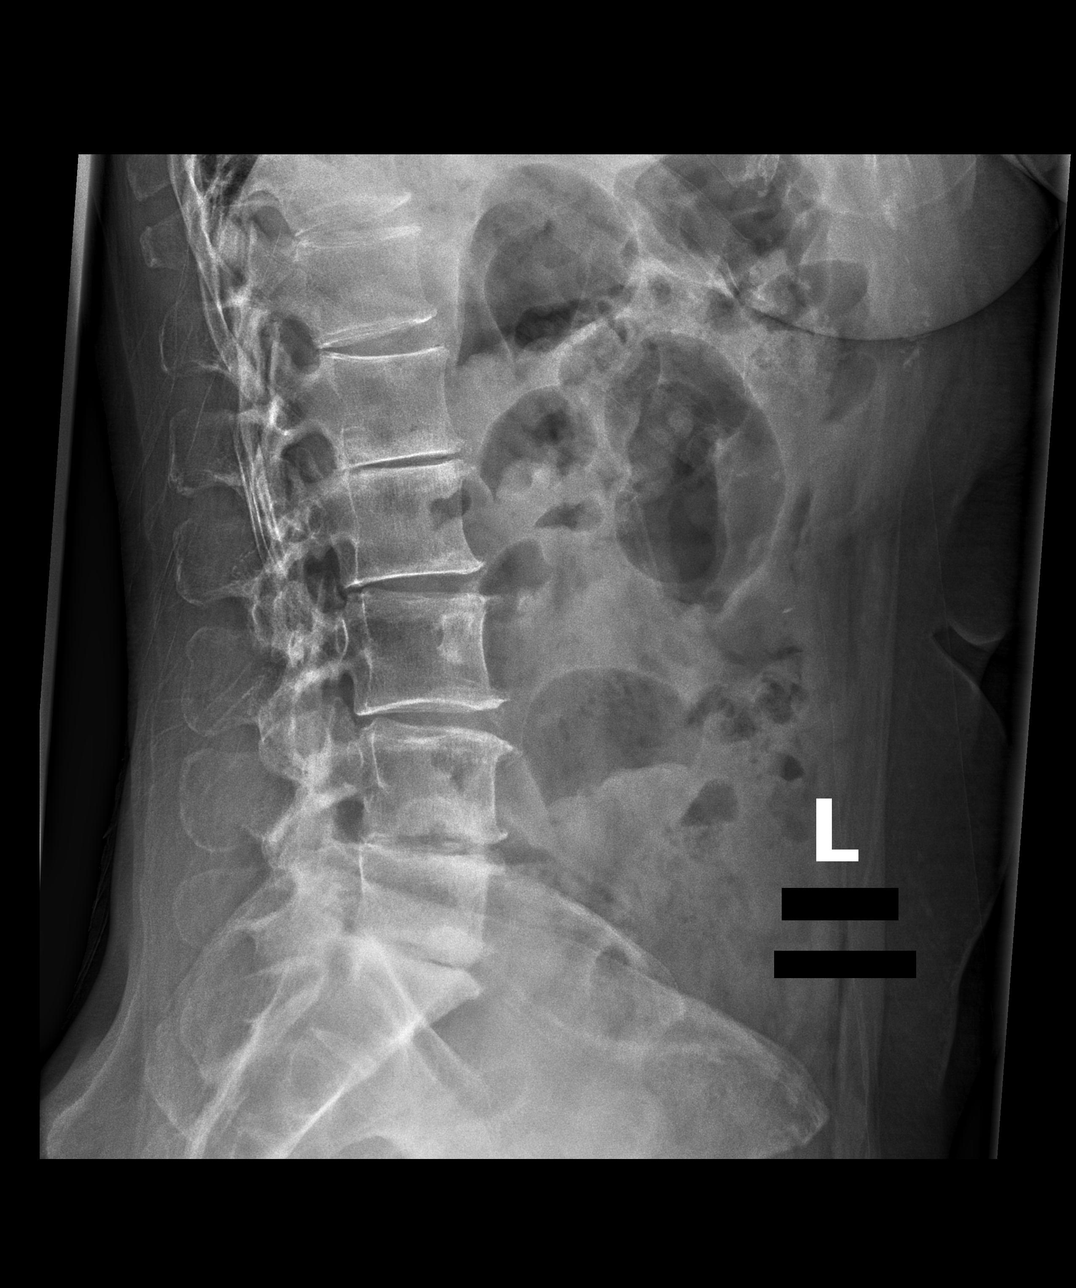
[im 3/4]
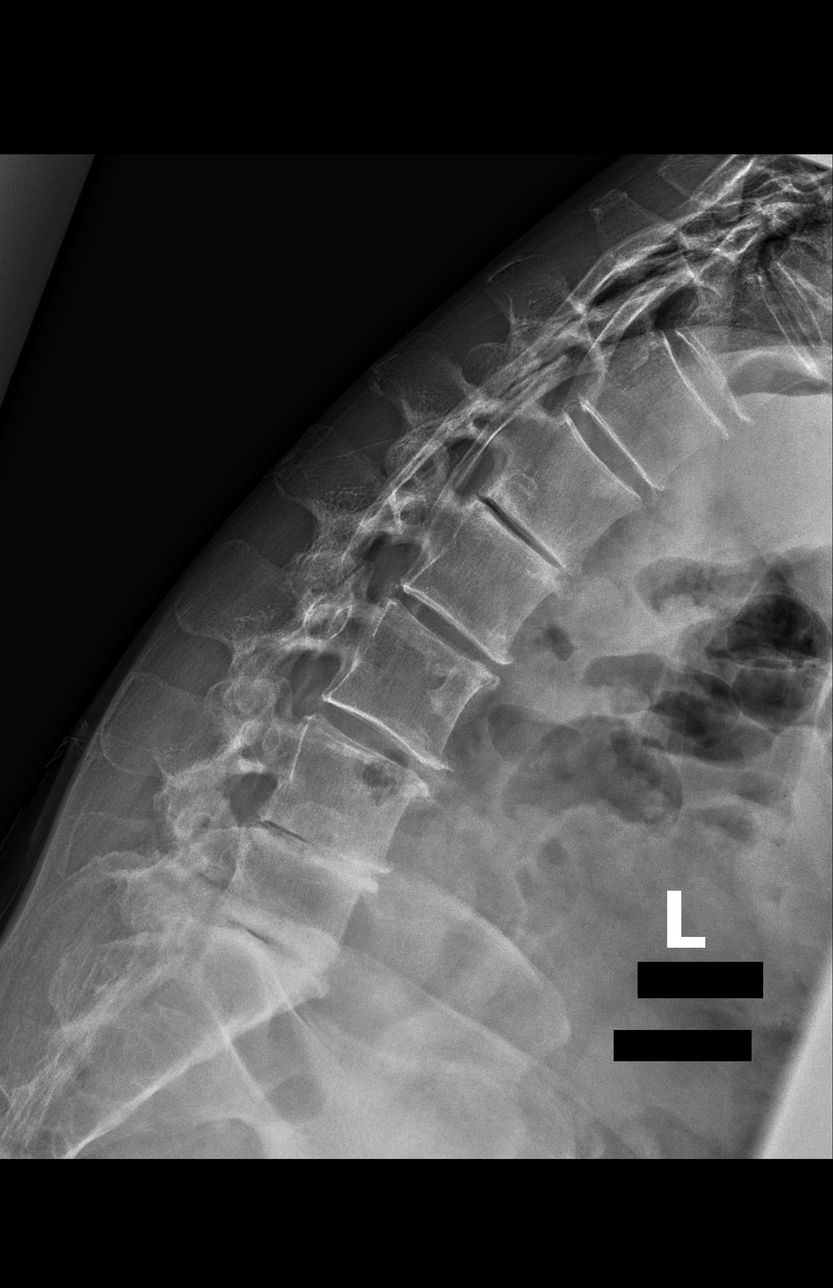
[im 4/4]
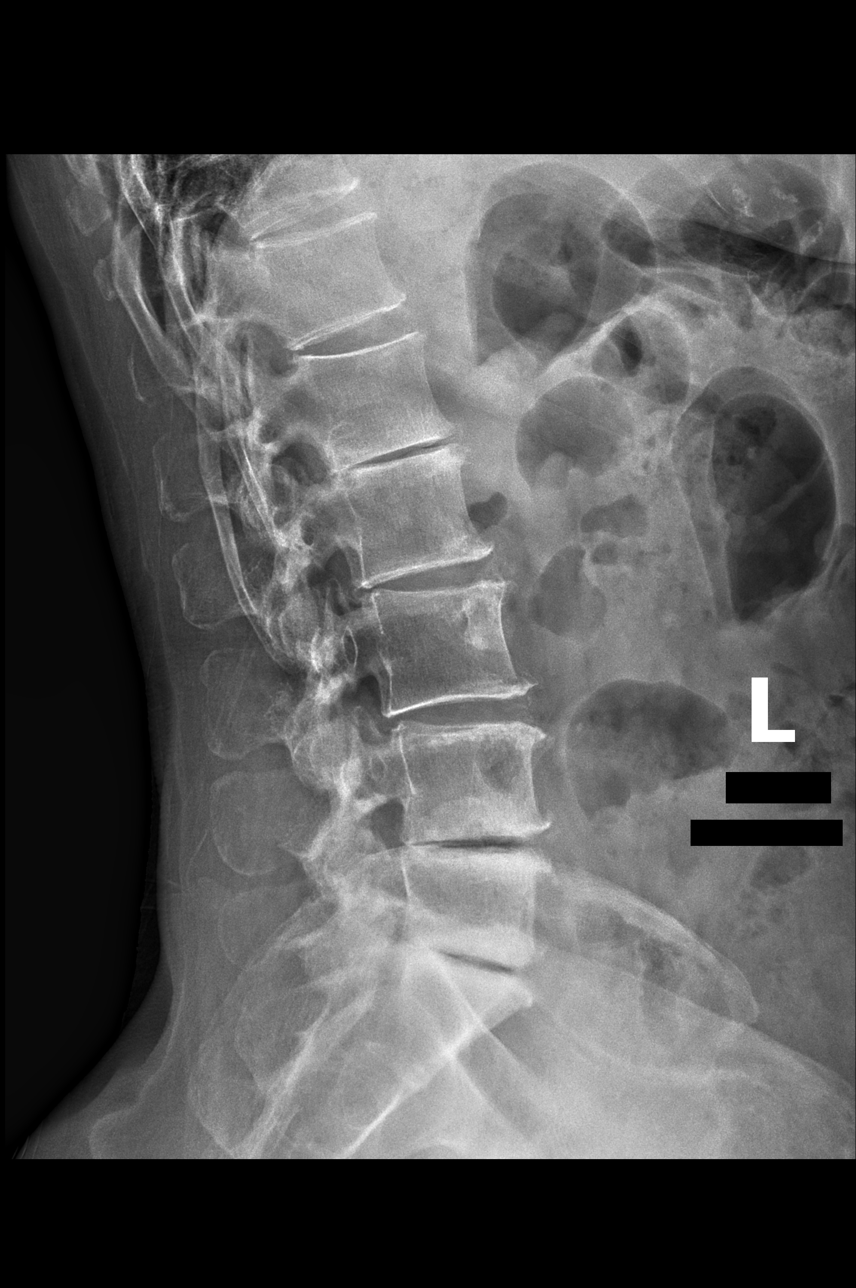

[4 of 4 positions shown; findings below may reference images not displayed]

FINDINGS: Lumbar vertebral heights are intact. There is slight anterolisthesis 
at L4-5, approximately 1 mm on the neutral lateral view. There is marked disc 
narrowing at L4-5 and L5-S1, moderate to marked at L3-4. Flexion-extension views 
show no dynamic subluxation. There is mild lumbar levoscoliosis. Facet changes 
are most pronounced at the lower 2 levels. Oblique views were not obtained. 
There are bilateral hip prostheses.
IMPRESSION: Degenerative changes, with slight anterolisthesis at L4-5 not showing mobility. 
There is mild lumbar levoscoliosis.

## 2022-05-28 IMAGING — MR MRI LUMBAR SPINE WITHOUT CONTRAST
5 of 7 series · 21 of 48 positions shown · IV contrast (gadolinium)
Comparison: Lumbar MRI July 05, 2020

________________________________________________________________________________________________ 
MRI LUMBAR SPINE WITHOUT CONTRAST, 05/28/2022 [DATE]: 
CLINICAL INDICATION: Pseudoarthrosis after fusion or arthrodesis. Fusion August 2020 Low back pain extending to bilateral buttocks.
TECHNIQUE: Sagittal T1, Sagittal T2, Sagittal STIR, Axial T1 and Axial T2 MR 
images of the lumbar spine were performed without intravenous gadolinium 
enhancement.

[Series 201: survey · axial · 10.0mm · 1.39mm/px · z∈[+13,+231]mm · 2 of 9 slices shown]
[im 1/9]
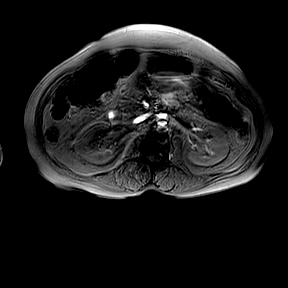
[im 9/9]
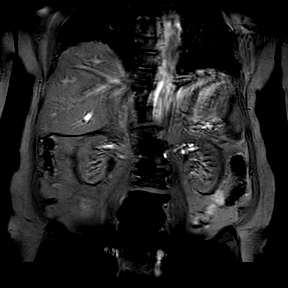

[Series 301: t2w_cor-surv · coronal · 6.0mm · 0.50mm/px · 2 of 5 slices shown]
[im 1/5]
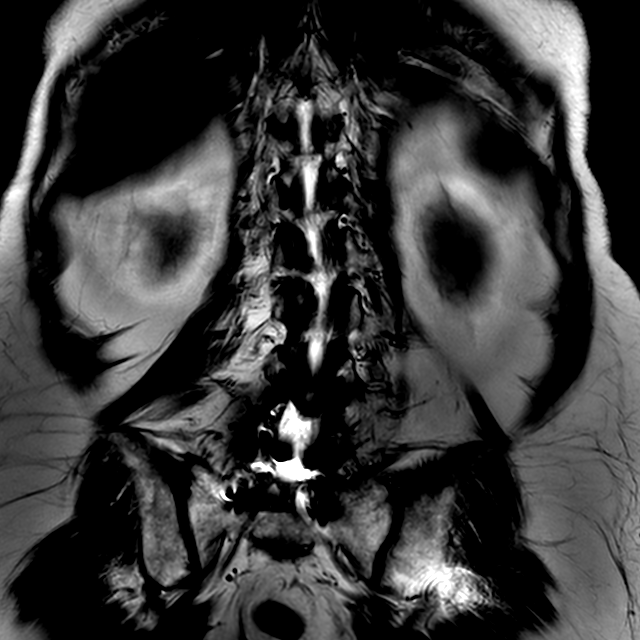
[im 5/5]
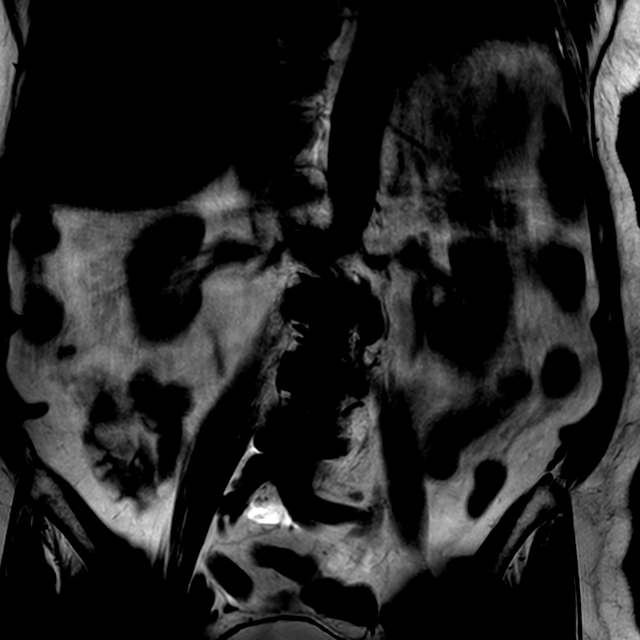

[Series 401: t2w_tse sag · sagittal · 4.2mm · 0.53mm/px · 1 of 18 slices shown]
[im 1/18]
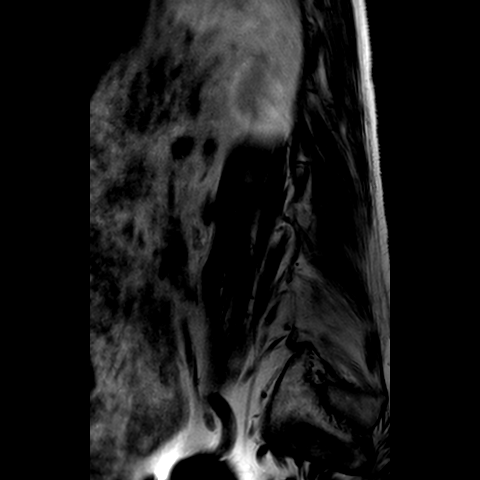

[Series 601: STIR · sagittal · 4.2mm · 0.63mm/px · 7 of 18 slices shown]
[im 1/18]
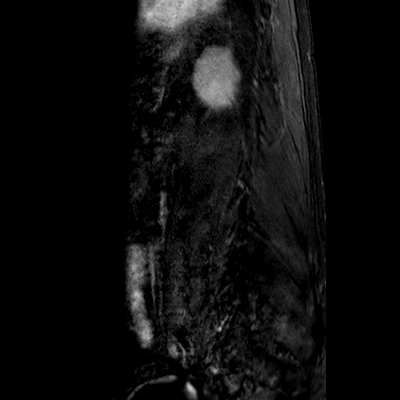
[im 3/18]
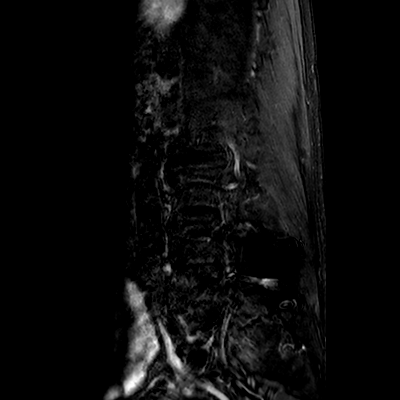
[im 6/18]
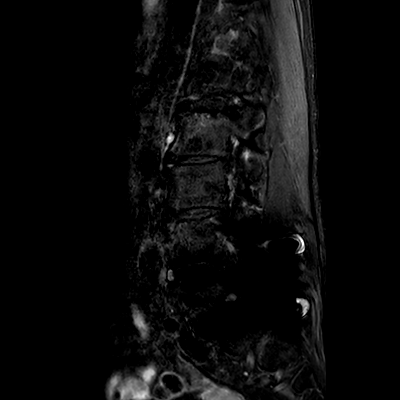
[im 9/18]
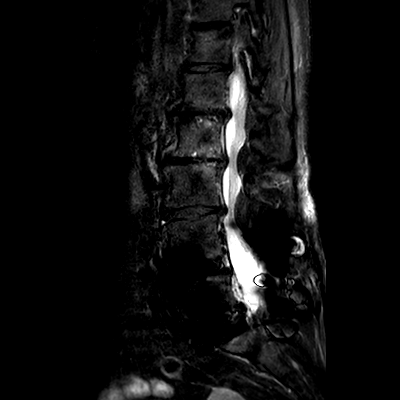
[im 12/18]
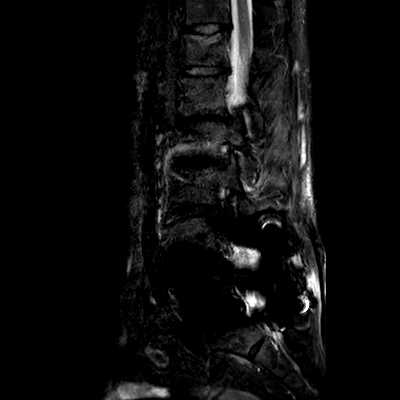
[im 15/18]
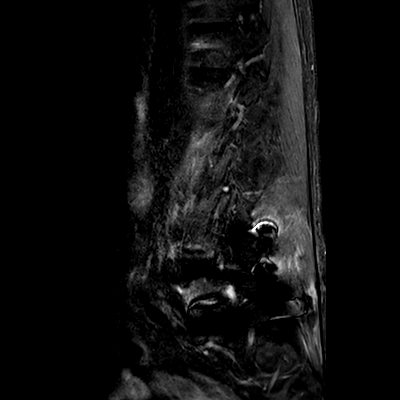
[im 18/18]
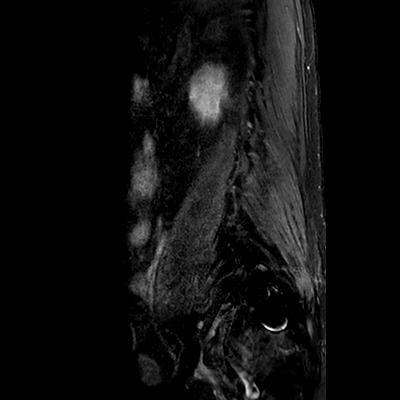

[Series 801: T1 · axial · 5.0mm · 0.35mm/px · z∈[-118,+79]mm · 9 of 34 slices shown]
[im 1/34]
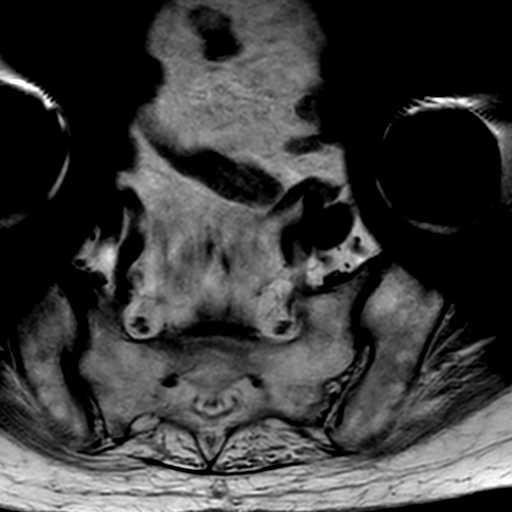
[im 7/34]
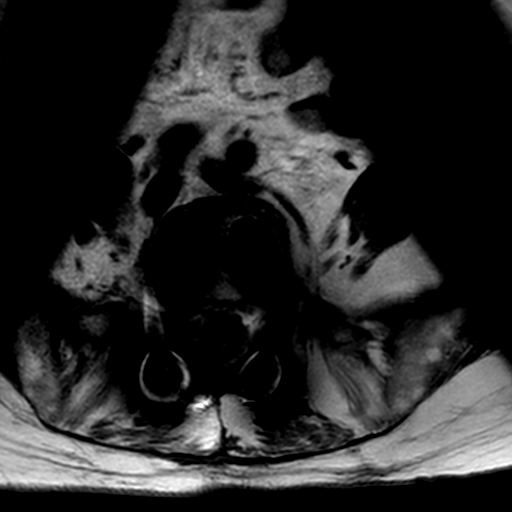
[im 10/34]
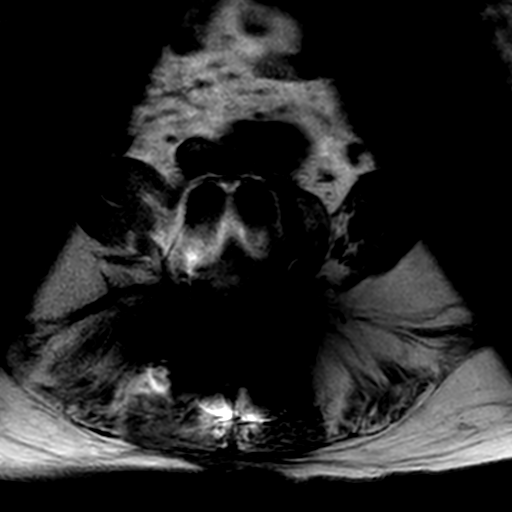
[im 16/34]
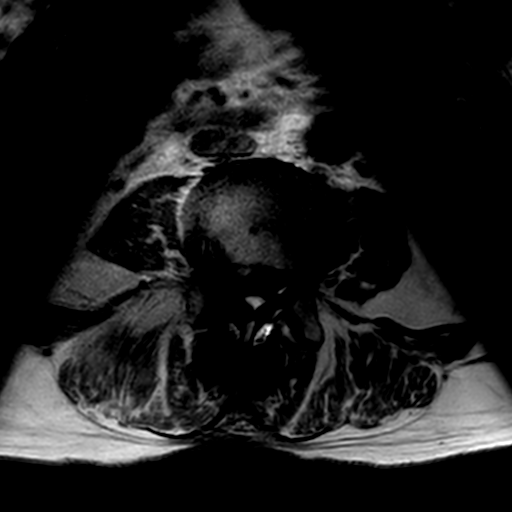
[im 19/34]
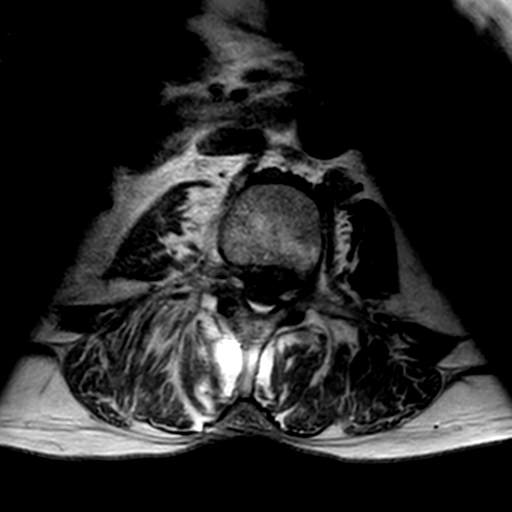
[im 25/34]
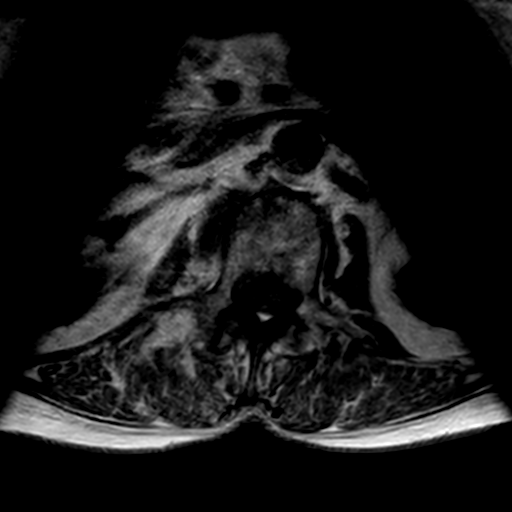
[im 28/34]
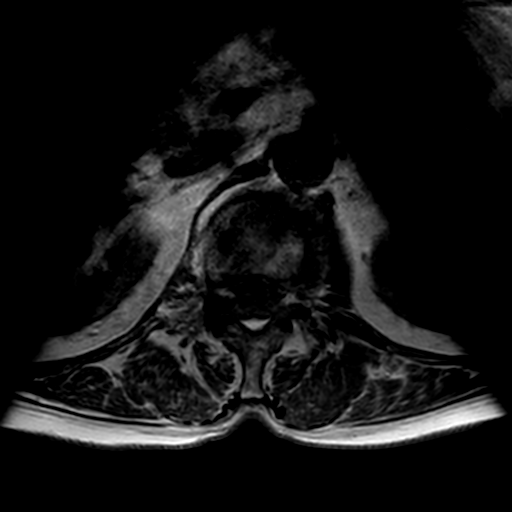
[im 31/34]
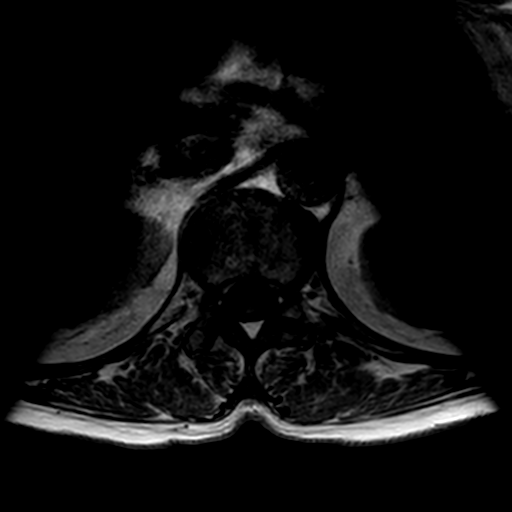
[im 34/34]
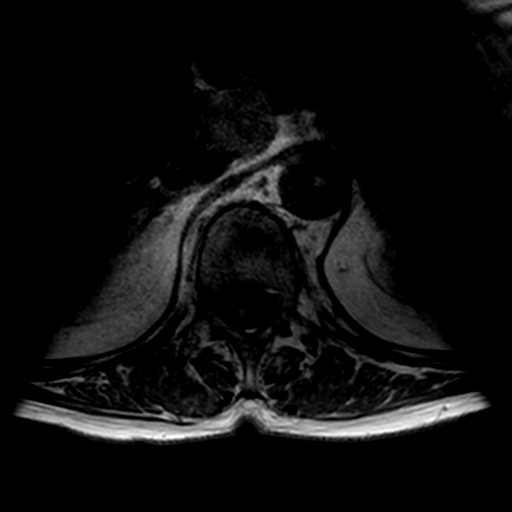

[21 of 48 positions shown; findings below may reference images not displayed]

FINDINGS: Todays study is mildly limited by motion artifact. 
Patient is status post L4-S1 pedicle screw and interbody disc cage fusion. Metal 
artifact limits detail. Hardware appears grossly well-positioned. 
Lumbar vertebral heights appear intact. The conus terminates opposite T12. 
There is moderate Modic type I change in the right at L2-3. There is marked disc 
narrowing at this level showing progression from the prior MRI. Marked disc 
narrowing at L1-2 with present previously. 
Moderate Schmorls node along the upper T12 endplate with mild edema was not 
present previously. This could be of recent onset as source of discogenic pain. 
At L5-S1 there appears to been posterior decompression. The canal is open. There 
is mild left foraminal stenosis, minimal on the right. 
At L4-5 there appears to have been posterior decompression. The canal is open. 
Foramina are open. 
At L3-4 there is broad-based disc bulge, axial T2 images [DATE], moderate facet 
change with ligamentous thickening and dorsal epidural lipomatosis producing 
moderate canal stenosis. There is encroachment on the L4 nerve roots bilaterally 
as they extend into the lateral recesses. Stenosis and nerve root impingement 
have worsened compared to the prior MRI. There is no significant foraminal 
stenosis. 
At L2-3 there is mild to moderate canal stenosis due to disc bulge, endplate 
osteophyte, dorsal epidural lipomatosis and moderate facet change with 
ligamentous thickening, axial image 18. This encroaches on the exiting L3 nerve 
roots bilaterally. This has worsened compared to the prior study. There is mild 
right foraminal stenosis, left foramen open. 
At L1-2 there is borderline-mild canal stenosis showing mild progression. Mild 
to moderate left, mild right foraminal stenosis. 
There is mild upper lumbar levoscoliosis. Grade 1 right lateral listhesis of L1 
and L2. Bilateral hip prostheses.
IMPRESSION: Status post L4-S1 pedicle screw and interbody disc cage fusion. Hardware appears 
grossly well-positioned, allowing for metal artifact. The canal is open at these 
levels. 
There is moderate canal stenosis at L3-4 and mild to moderate canal stenosis at 
L2-3, showing progression at both levels. There is moderate Modic type I change 
in the right at L2-3, which is new. 
Schmorls node along the upper T12 endplate shows surrounding edema, potentially 
of recent onset as source of discogenic pain. 
No evidence for compression fracture or spinal malignancy. 
Mild upper lumbar levoscoliosis. 
Bilateral hip prostheses.

## 2022-07-24 NOTE — Telephone Encounter (Signed)
Formatting of this note might be different from the original.  REFILL REQUEST    Received via rx request     LOV: [id number]  FOV: [id number]  LRF: [id number]    Is this a controlled medication? no  Is CMA needed? no  Is 90/180 Day needed? no    Pharmacy confirmed? yes    OARRS/INSPECT/KASPER have not been reviewed for this patient.      Electronically signed by Werner Lean at 07/24/2022  9:11 AM EST

## 2022-08-07 IMAGING — CT CT LUMBAR SPINE WITHOUT CONTRAST
3 of 4 series · 11 of 33 positions shown, 13 images · non-contrast
Comparison: MRI lumbar spine from May 28, 2022.

________________________________________________________________________________________________ 
CT LUMBAR SPINE WITHOUT CONTRAST, 08/07/2022 [DATE]: 
CLINICAL INDICATION: Continued pain. Lumbar surgery. 
A search for DICOM formatted images was conducted for prior CT imaging studies 
completed at a non-affiliated media free facility.
TECHNIQUE: The lumbar spine was scanned from T12 through mid-sacrum without 
contrast on a high-resolution CT scanner using dose reduction techniques. 
Routine MPR reconstructions were performed. Count of known CT and Cardiac 
Nuclear Medicine studies performed in the previous 12 months = 0.

[Series 3: l spine 2.0 b31s · axial · 0.29mm/px · z∈[-259,-99]mm · 3 of 121 slices shown, 4 images]
[im 21/121  soft-tissue]
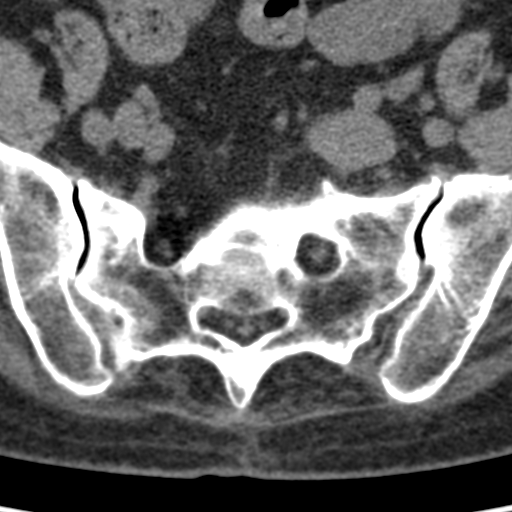
[im 21/121  bone]
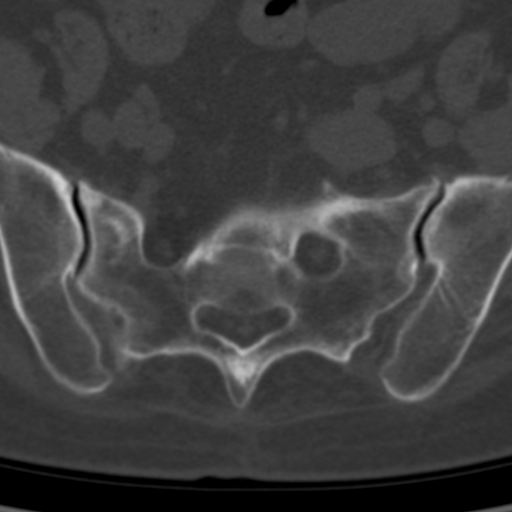
[im 61/121  bone]
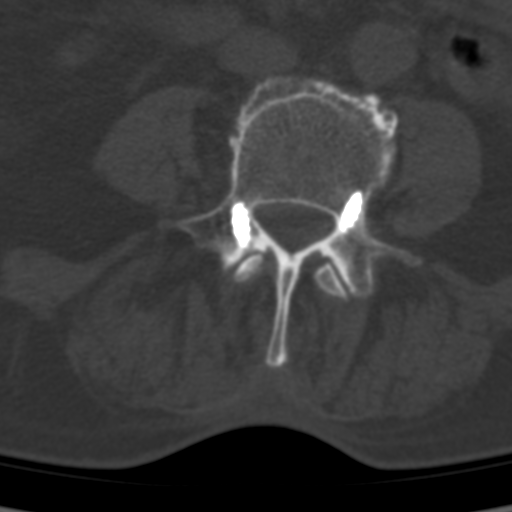
[im 101/121  bone]
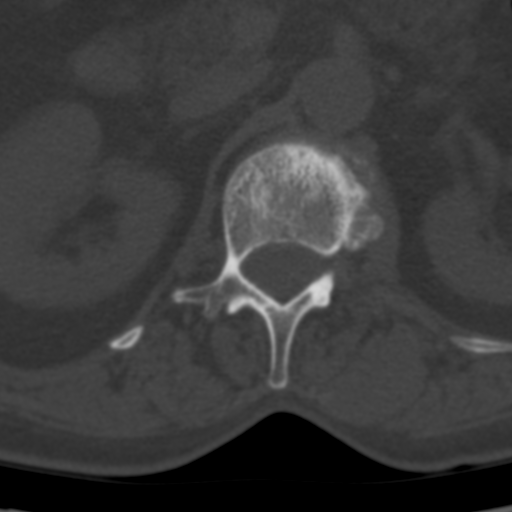

[Series 7: sagittal st · sagittal · 0.29mm/px · 5 of 74 slices shown, 6 images]
[im 25/74  bone]
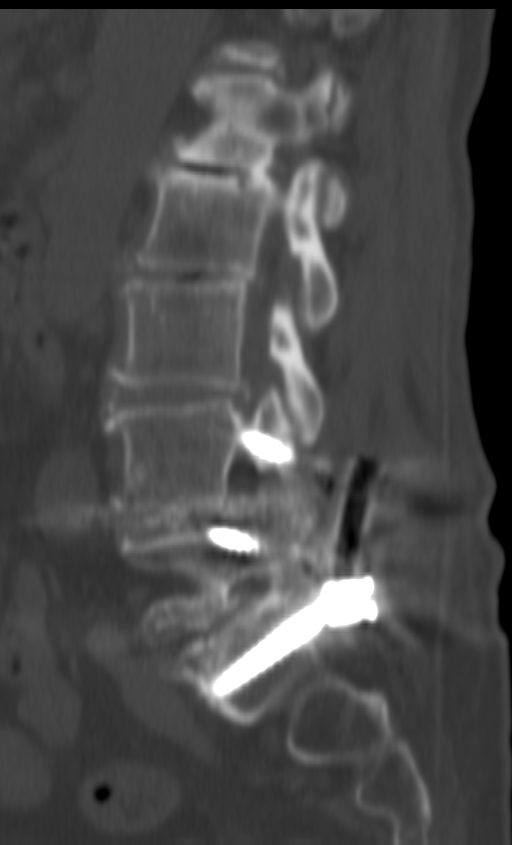
[im 31/74  bone]
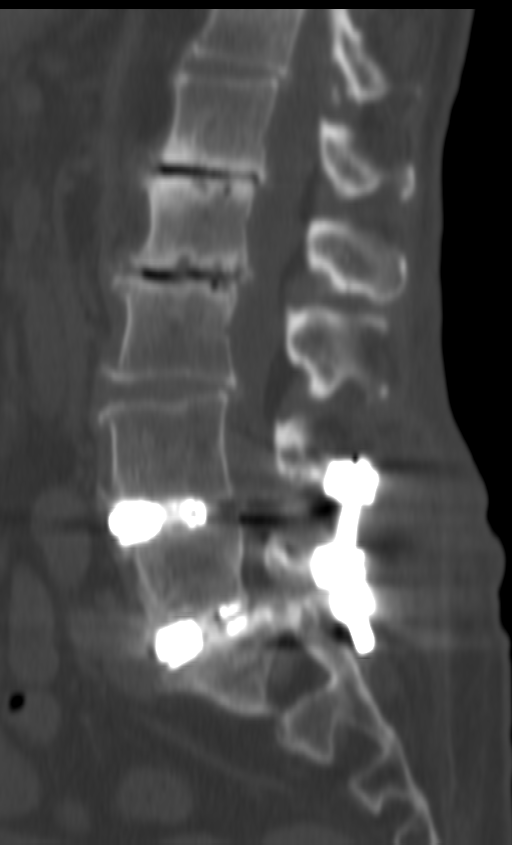
[im 37/74  soft-tissue]
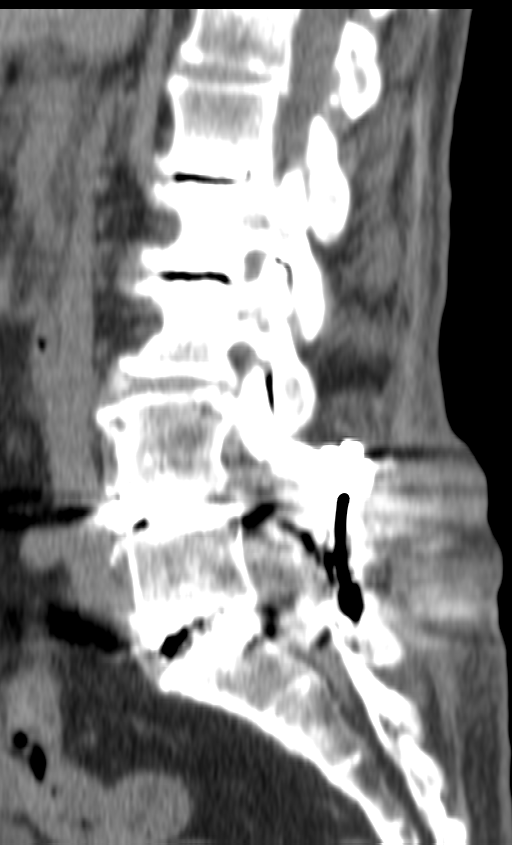
[im 37/74  bone]
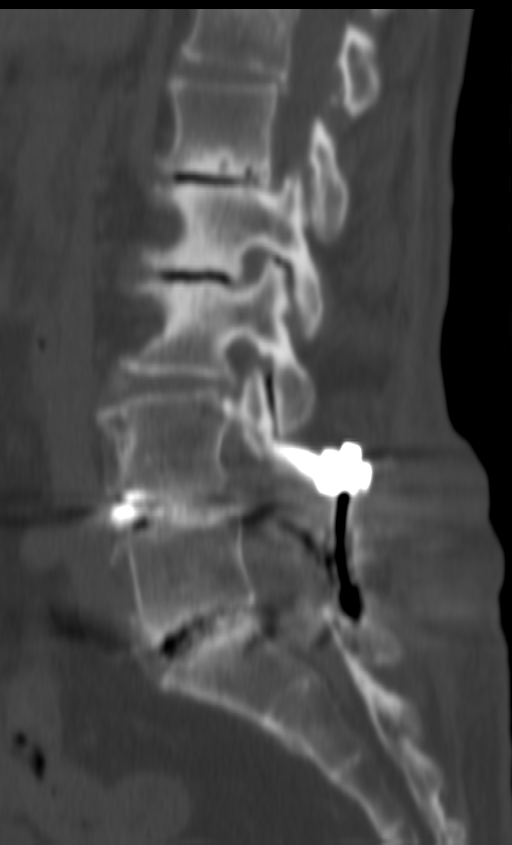
[im 43/74  bone]
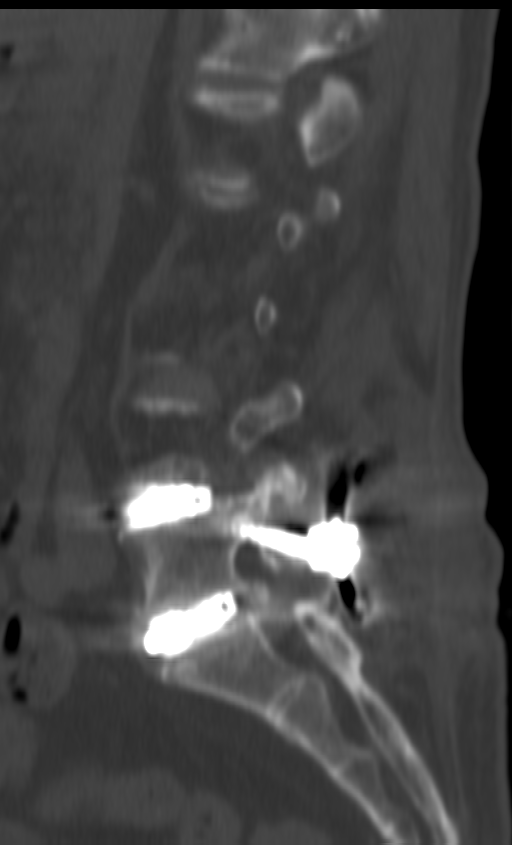
[im 49/74  bone]
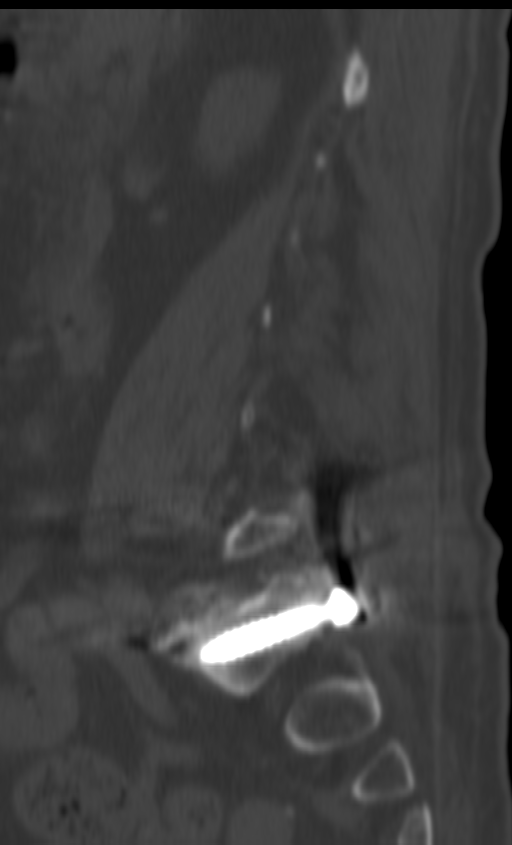

[Series 8: cor st · coronal · 0.30mm/px · 3 of 67 slices shown]
[im 14/67  bone]
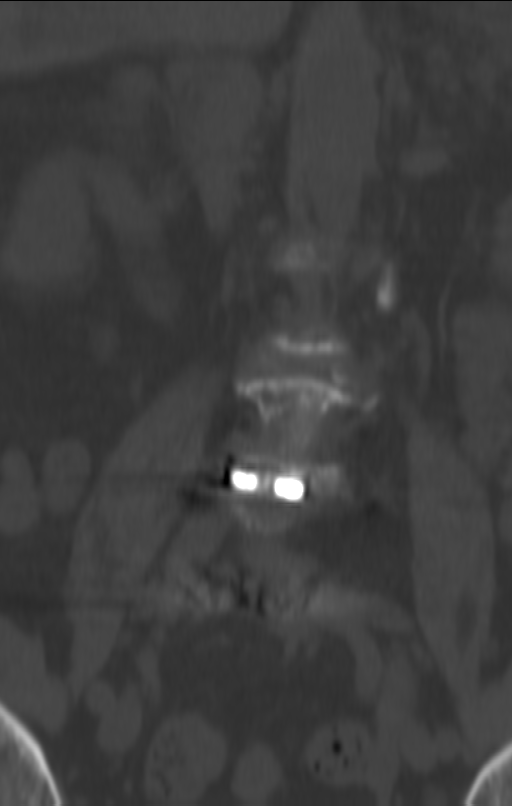
[im 27/67  bone]
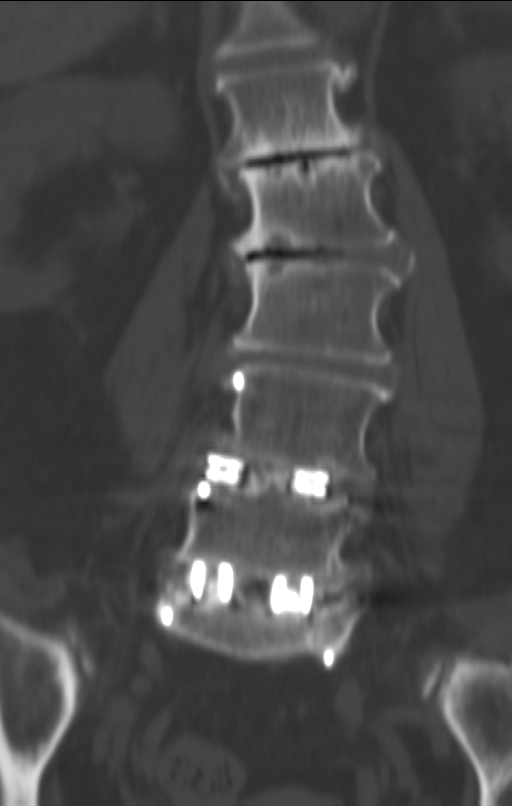
[im 40/67  bone]
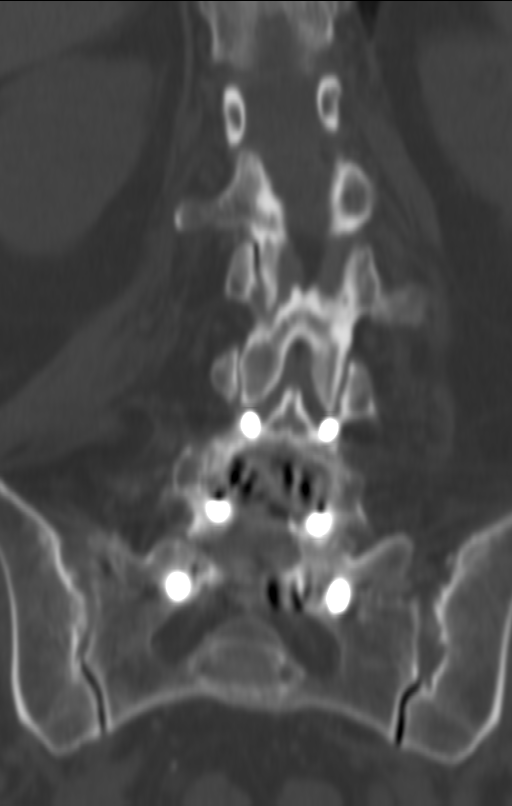

[11 of 33 positions shown; findings below may reference images not displayed]

FINDINGS: -------------------------------------------------------------------------------- 
------ 
SURGICAL CHANGE: 
Postsurgical changes of lumbar hardware fusion with pedicle screws and posterior 
fusion rods of L4-S1. Bilateral L4 pedicle screws extend to the superior 
endplates of L4. Bilateral L5 pedicle screws extend to the superior endplates of 
L5, noting extension of the tip of the left pedicle screw into the L4-L5 disc 
space. There is no evidence of hardware fracture. Lucency is noted surrounding 
the right S1 pedicle screw concerning for hardware loosening. 
Discectomy with intervertebral spacer placement at the L4-L5 and L5-S1 levels. 
Osseous fusion across the discectomy sites is present. 
Laminectomy at L4-L5 and L5-S1. Right facetectomy at L4-L5 and L5-S1. Surgical 
screw tract from a removed screw within S1 on the left side. 
-------------------------------------------------------------------------------- 
------ 
GENERAL: 
Mild rightward lateral subluxation of L1 relative to L2. Leftward curvature 
centered at L2-L3. Mild retrolisthesis of L1 and L2, L2 on L3, L3 on L4. Mild 
retrolisthesis of L5 on S1. No acute lumbar spine fracture. No focal suspect 
lytic or blastic lesions are present. Visualized extraspinal soft tissues are 
unremarkable. 
Discogenic/degenerative change present, stable from prior MRI.  
-------------------------------------------------------------------------------- 
------
IMPRESSION: Postsurgical changes of lower lumbar hardware fusion without evidence of 
hardware fracture. Lucency along the right S1 pedicle screw concerning for 
loosening.  
RADIATION DOSE REDUCTION: All CT scans are performed using radiation dose 
reduction techniques, when applicable.  Technical factors are evaluated and 
adjusted to ensure appropriate moderation of exposure.  Automated dose 
management technology is applied to adjust the radiation doses to minimize 
exposure while achieving diagnostic quality images.

## 2022-09-01 NOTE — Telephone Encounter (Signed)
Formatting of this note might be different from the original.  REFILL REQUEST    Received via phone     LOV: [id number]  FOV: [id number]  LRF: [id number]    Is this a controlled medication? yes  Is CMA needed? yes  Is 90/180 Day needed? yes    Pharmacy confirmed? yes    OARRS/INSPECT/KASPER have not been reviewed for this patient.      Electronically signed by Werner Lean at 09/01/2022 12:59 PM EDT

## 2023-01-26 NOTE — Telephone Encounter (Signed)
Formatting of this note might be different from the original.  REFILL REQUEST    Received via mychart    LOV: 02-24-22  FOV: 02-26-23  LRF: 12-18-22    Is this a controlled medication? no  Is CMA needed? no  Is 90/180 Day needed? no    Pharmacy confirmed? yes    OARRS/INSPECT/KASPER was last reviewed on: 12/18/2022 11:04 AM by Melba Coon.      Electronically signed by Chandra Batch at 01/26/2023  5:10 PM EDT

## 2023-01-26 NOTE — Telephone Encounter (Signed)
Formatting of this note might be different from the original.  REFILL REQUEST    Received via Phone    LOV: 09/25/823  FOV: 02/25/22  LRF: 12/18/22    Is this a controlled medication? yes  Is CMA needed? yes  Is 90/180 Day needed? no    Pharmacy confirmed? yes    OARRS/INSPECT/KASPER was last reviewed on: 12/18/2022 11:04 AM by Melba Coon.      Electronically signed by Lesly Rubenstein, LPN at 32/20/2542 11:15 AM EDT

## 2023-01-26 NOTE — Telephone Encounter (Signed)
Formatting of this note might be different from the original.  Patient, [phone] , calling to ask that the script for zolpidem be sent in as a refill and not a new medication.    She will have it transferred to [place] after it is sent in.  Therefore, cannot be a new script.  Pharmacist asked her to call the office and advise of this.   Electronically signed by Orlene Erm at 01/26/2023 11:47 AM EDT

## 2023-01-30 NOTE — Telephone Encounter (Signed)
Formatting of this note might be different from the original.  REFILL REQUEST    Received via phone     LOV:  02/24/22  FOV:  02/26/23  LRF: 12/11/22    Is this a controlled medication? no  Is CMA needed? no  Is 90/180 Day needed? no    Pharmacy confirmed? yes    OARRS/INSPECT/KASPER was last reviewed on: 01/26/2023  5:29 PM by Melba Coon.      Electronically signed by Derrill Memo at 01/30/2023 11:16 AM EDT

## 2023-02-26 NOTE — Progress Notes (Signed)
Formatting of this note is different from the original.    Subjective   Theresa English is a 67 year old female is here for   Chief Complaint   Patient presents with    Medicare Wellness       Patient Care Team:  Melba Coon, DO as PCP - General (Family Medicine)    Health Maintenance   Topic Date Due    RSV Vaccine (60+ or Pregnant) (1 - 1-dose 60+ series) Never done    COVID-19 Vaccine (3 - 2023-24 season) 02/01/2023    Mammogram Screening  04/13/2023    Wellness 65 and over  02/27/2024    DTap,Tdap,and Td (2 - Td or Tdap) 11/07/2024    Colonoscopy  05/13/2026    DEXA SCAN  Completed    Shingrix  Completed    Influenza Vaccine  Completed    Hepatitis C Screening  Completed    Pneumococcal 65+  Completed    HPV  Aged Out    Meningococcal conjugate valent 4 (MCV4)  Aged Out    RSV Immunization (<20 months)  Aged Out     Past Medical History:   Diagnosis Date    Cellulitis     right hip    Hypertension     Parkinson disease (HCC)     Tailbone injury     February 2022-pt fell     Past Surgical History:   Procedure Laterality Date    BACK SURGERY      April 2022    HAND SURGERY Right     thumb    HIP ARTHROPLASTY Left     MAMMOPLASTY/AUGMENTATION-BILATERAL Bilateral 1990-2000s    OTHER SURGICAL HISTORY Left     left big toe    OTHER SURGICAL HISTORY      eye surgery    TOTAL HIP ARTHROPLASTY Right     WISDOM TOOTH EXTRACTION      WRIST SURGERY  2023     Family History   Problem Relation Age of Onset    Heart Disease Mother     Breast cancer Mother 34        late 35s    Heart Disease Father     Breast cancer Sister 82    Endometrial cancer Neg Hx     Ovarian cancer Neg Hx      Social History     Socioeconomic History    Marital status: Married   Tobacco Use    Smoking status: Never    Smokeless tobacco: Never   Vaping Use    Vaping status: Never Used   Substance and Sexual Activity    Alcohol use: Yes     Alcohol/week: 1.0 standard drink of alcohol     Types: 1 Glasses of wine per week     Comment: OCC    Drug use: Not  Currently    Sexual activity: Yes     Partners: Male   Other Topics Concern    Appropriate use of safety belts in car? Yes    Do you perform a monthly self breast exam? Yes    Do you take any type medication for Osteoporosis? No    Do you exercise? No     Social Determinants of Health     Food Insecurity: Not At Risk (02/26/2023)    Food Insecurities     Worried about running out of food: No     Food Bought: No   Transportation Needs: Not At Risk (02/26/2023)  Transportation     Worried about transportation: No   Intimate Partner Violence: Not At Risk (02/26/2023)    Interpersonal Safety     Feel physically or emotionally unsafe where currently live: No     Harm by anyone: No     Emotionally Harmed: No   Housing Stability: Not At Risk (02/26/2023)    Housing/Utilities     Worried about losing home: No     Stayed outside house: No     Unable to get utilities: No     Immunization History   Administered Date(s) Administered    Betamethasone 09/20/2014, 03/19/2016    COVID-19 mRNA Vaccine (Pfizer EUA 12+) Purple Cap 09/13/2019, 04/30/2020    Influenza Vaccine, Inactivated Trivalent High-Dose PF (Fluzone HD) 02/26/2023    Influenza Vaccine, Inactivated, Quadrivalent PF 04/05/2017, 03/05/2018    Influenza Virus Vaccine Quadrivalent (Flucelvax) Injection 02/20/2020    Influenza, High Dose Quad 0.7 Ml Dose 02/21/2021, 01/20/2022    Pneumococcal Conjugate (PCV20) Prevnar 20 01/20/2022    Tdap (Tetanus, Diphtheria & Pertussis) 11/08/2014    Tetanus & Diphtheria Toxoids, 2 Lf, Adsorbed, PF (TDVAX) 06/09/2007    Xylocaine 09/20/2014, 09/20/2014, 03/19/2016, 03/19/2016    Zoster Vaccine, Recombinant 01/20/2018, 03/21/2018     Hearing and Vision  Far left: Sees an Optometrist  Far right: Sees an Optometrist  Have you noticed any hearing difficulties?: No    Over the past 2 weeks, how often have you been bothered by any of the following problems?  Little interest or pleasure in doing things: Several days  Feeling down, depressed, or  hopeless: Several days  Patient Health Questionnaire-2 Score: 2    If you checked off any problems on this questionnaire so far,  How difficult have these problems made it for you to do your work, take care of things at home, or get along with other people?: Not difficult at all    Three Word Registration:  Choose 3 words from the list below and indicate how many the patient recalled: Banana;Sunrise;Chair  How many words did the patient recall?: 2    Does your home have?  Rugs in the hallway: No  Grab bars in bathroom: No  Lack handrails on stairs: No  Poor lighting : No  Do you have smoke detectors in your home?: Yes  Do change the batteries? : Yes    Does patient need help with:  Phone: no  Transportation: no  Shopping: no  Preparing meals : no  Housework: no  Laundry: no  Medications: no  Managing money: no    Fall Prevention: In the past year, has the patient experienced:  One or more falls in the past year: Yes  How many times?: 1  Was the patient injured in any fall?: Yes (patient broke wrist)  Does patient feel unsteady when standing or walking?: No  Does patient worry about falling?: No  Does patient have a gait, strength, or balance problem?: No  Fall Risk Level: High Risk      Are you currently experiencing pain? (if no, score=0)  If yes then: Please rate your pain on a scale from 1 to 10, 10 being the most intense pain.: 0  Where is the location of your pain?: na    Social Determinants of Health      Outpatient Medications Marked as Taking for the 02/26/23 encounter (Office Visit) with Melba Coon, DO   Medication Sig    carbidopa-levodopa (SINEMET) 25-100 mg TABS Take 3 tablets by  mouth 3 (three) times daily.    doxazosin (CARDURA) 2 MG TABS Take 1 tablet by mouth daily as needed.    DULoxetine (CYMBALTA) 60 MG CPEP Take 1 capsule by mouth daily.    zolpidem (AMBIEN CR) 12.5 MG TBCR tablet Take 1 tablet by mouth at bedtime for 30 days.    tiZANidine (ZANAFLEX) 4 MG TABS Take 1 tablet by mouth 2 (two)  times daily.    Sennosides (SENNA) 8.6 MG CAPS Take 1-2 capsules daily PRN constipation    metoprolol succinate (TOPROL-XL) 50 mg extended-release tablet Take 1 tablet by mouth daily.    doxazosin (CARDURA) 2 MG TABS TAKE 1 TABLET BY MOUTH DAILY AS NEEDED    rosuvastatin (CRESTOR) 20 MG TABS Take 1 tablet by mouth once daily    Calcium-Vitamin D3 (CALCIUM 600/VITAMIN D) 600-400 MG-UNIT TABS Take 1 tablet PO BID.    tretinoin (RETIN-A) 0.1 % CREA SMARTSIG:Sparingly Topical    amlodipine (NORVASC) 5 MG TABS TAKE 1 TABLET BY MOUTH DAILY    oxycodone-acetaminophen (PERCOCET) 5-325 MG TABS Take 1 tablet by mouth every 4 (four) hours as needed for Moderate Pain (4-6).    B Complex Vitamins (VITAMIN-B COMPLEX) TABS Take 1 tablet by mouth at bedtime.    lorazepam (ATIVAN) 0.5 MG TABS Take 0.5 mg by mouth every 6 (six) hours as needed.    ibuprofen (ADVIL,MOTRIN) 800 MG TABS Take 800 mg by mouth every 6 (six) hours as needed for Mild Pain (1-3).    Glucosamine-Chondroitin 1500-1200 MG/30ML LIQD Take 1 capsule by mouth daily.     Allergies   Allergen Reactions    Tramadol Itch    Voltaren [Diclofenac] Rash     HPI    The patient is a 67 year old female who presents today for Medicare Annual Wellness visit.    We reviewed her lab work from 02/23/2023 in the office today: CBC showed WBC 5.7, hemoglobin 11.5, hematocrit 37.5, platelets 301; CMP showed AST 9, ALT 5, ALK Phos 104, GLU 77, Na 141,k K 4.1, BUN 17, Cr 1.04, Estimated GFR 59; hemoglobin A1c was 5.5; lipid panel showed LDL was 65, HDL was 85, cholesterol was 165, triglycerides were 79; TSH was 2.740.    She reports she has been diagnosed with Parkinson's disease and feels a bit shaky today in the office. She states it is mostly in her legs. Denies any difficulty walking. States if she is laying down her legs start trembling. She takes Sinemet 25-100 mg, 3 tablets by mouth three times a day daily.    She states she takes tizanidine 4 mg, 1 tablet by mouth twice a day  daily and Cymbalta 60 mg, 1 capsule by mouth daily for muscle pain. Need refills.    She has a history of hypertension. She takes amlodipine 5 mg daily, metoprolol succinate 50 mg extended-release 1 tablet by mouth daily, and doxazosin 2 mg, 1 tablet by mouth daily as needed. Needs a refill for doxazosin. Her in-clinic blood pressure is noted to be 108/62.     She complains of abnormal urine odor.     She reports she has been getting sick a lot and has been having an emesis.     She has a history of insomnia. She takes zolpidem 12.5 mg, 1 tablet by mouth at bedtime. Needs a refill.     She has a history of chronic constipation. She reports he has not had a bowel movement for about a week. She states the other day  she took 2 laxatives, one in the morning and one at night, drank coffee, prune juice, and MiraLAx. She took a suppository this morning and was able to have a bowel movement.     She has a history of depression with anxiety.     She has a history of hypercholesterolemia. She takes Crestor 20 mg, 1 tablet by mouth once daily.    She sustained a fall on 01/17/2023, and she injured her right wrist de to a dizzy spell and loss of balance.    She has prurigo on her upper and lower extremities bilaterally.    Vitals:    02/26/23 1313   BP: 108/62   BP Site: Right   Patient Position: Sitting   Cuff Size: Regular   Pulse: 80   Temp: 98.1 F (36.7 C)   SpO2: 96%   Weight: 134 lb (60.8 kg)   Height: 66 (167.6 cm)       Body mass index is 21.63 kg/m.    Objective   Objective:    Review of Systems    Physical Exam  Vitals and nursing note reviewed.   Constitutional:       General: She is not in acute distress.     Appearance: Normal appearance.   HENT:      Head: Normocephalic and atraumatic.      Right Ear: Tympanic membrane, ear canal and external ear normal. There is no impacted cerumen.      Left Ear: Tympanic membrane, ear canal and external ear normal. There is no impacted cerumen.   Eyes:      Extraocular  Movements: Extraocular movements intact.      Pupils: Pupils are equal, round, and reactive to light.   Cardiovascular:      Rate and Rhythm: Normal rate and regular rhythm.      Heart sounds: Normal heart sounds. No murmur heard.     No friction rub. No gallop.      Comments: Normal S1S2  Pulmonary:      Effort: Pulmonary effort is normal.      Breath sounds: Normal breath sounds. No wheezing, rhonchi or rales.   Abdominal:      General: Bowel sounds are normal.      Palpations: Abdomen is soft.      Tenderness: There is no abdominal tenderness.   Musculoskeletal:         General: No deformity. Normal range of motion.      Cervical back: Normal range of motion and neck supple.      Right lower leg: No edema.      Left lower leg: No edema.   Lymphadenopathy:      Cervical: No cervical adenopathy.   Skin:     General: Skin is warm and dry.      Comments: She had prurigo on her upper and lower extremities bilaterally.   Neurological:      General: No focal deficit present.      Mental Status: She is alert and oriented to person, place, and time.      Sensory: No sensory deficit.      Motor: No weakness.      Comments: Patient had a bit of a memory impairment with short term memory. She scored 2 out of 3 on the Medicare wellness questions.   Psychiatric:         Mood and Affect: Mood normal.         Behavior: Behavior normal.  Thought Content: Thought content normal.         Judgment: Judgment normal.     Procedures    Chronic Disease Management    Assessment & Plan:    Encounter Diagnoses    ICD-10-CM    1. Medicare annual wellness visit, subsequent  Z00.00      2. Abnormal urine odor  R82.90 URINE DIP     CULTURE URINE     3. Chronic insomnia  F51.04 zolpidem (AMBIEN CR) 12.5 MG TBCR tablet     4. Memory disturbance  R41.3      5. Myofascial muscle pain  M79.18 DULoxetine (CYMBALTA) 60 MG CPEP     tiZANidine (ZANAFLEX) 4 MG TABS     6. Chronic idiopathic constipation  K59.04 Sennosides (SENNA) 8.6 MG CAPS      7. Depression with anxiety  F41.8 DULoxetine (CYMBALTA) 60 MG CPEP     8. Essential hypertension  I10 doxazosin (CARDURA) 2 MG TABS     9. At high risk for falls  Z91.81 AMB REFERRAL TO OCCUPATIONAL THERAPY     AMB REFERRAL TO PHYSICAL THERAPY     AMB REFERRAL TO CARE MANAGEMENT     10. Need for influenza vaccination  Z23 INFLUENZA VACCINE HIGH-DOSE TRIVALENT IM (FLUZONE HIGH-DOSE)     IMMUNIZ, ADMIN, FIRST     11. Screening for osteoporosis  Z13.820 DXA BONE DENSITY       Considerations        End of life planning was not discussed during this visit.    Goals & Recommendations:  Theresa English was seen today for medicare wellness.    Diagnoses and all orders for this visit:    Medicare annual wellness visit, subsequent    Abnormal urine odor  -     URINE DIP  -     CULTURE URINE; Future    Chronic insomnia  -     zolpidem (AMBIEN CR) 12.5 MG TBCR tablet; Take 1 tablet by mouth at bedtime for 30 days.    Memory disturbance    Myofascial muscle pain  -     DULoxetine (CYMBALTA) 60 MG CPEP; Take 1 capsule by mouth daily.  -     tiZANidine (ZANAFLEX) 4 MG TABS; Take 1 tablet by mouth 2 (two) times daily.    Chronic idiopathic constipation  -     Sennosides (SENNA) 8.6 MG CAPS; Take 1-2 capsules daily PRN constipation    Depression with anxiety  -     DULoxetine (CYMBALTA) 60 MG CPEP; Take 1 capsule by mouth daily.    Essential hypertension  -     doxazosin (CARDURA) 2 MG TABS; Take 1 tablet by mouth daily as needed.    At high risk for falls  -     AMB REFERRAL TO OCCUPATIONAL THERAPY; Future  -     AMB REFERRAL TO PHYSICAL THERAPY; Future  -     AMB REFERRAL TO CARE MANAGEMENT; Future    Need for influenza vaccination  -     INFLUENZA VACCINE HIGH-DOSE TRIVALENT IM (FLUZONE HIGH-DOSE)  -     IMMUNIZ, ADMIN, FIRST    Screening for osteoporosis  -     DXA BONE DENSITY; Standing    Medicare annual wellness visit, subsequent  The patient is a 67 year old female who presents today for Medicare Annual Wellness visit. Patient's  concerns were discussed in the clinic today. We reviewed her lab work from 02/23/2023 in the office today: CBC showed WBC  5.7, hemoglobin 11.5, hematocrit 37.5, platelets 301; CMP showed AST 9, ALT 5, ALK Phos 104, GLU 77, Na 141,k K 4.1, BUN 17, Cr 1.04, Estimated GFR 59; hemoglobin A1c was 5.5; lipid panel showed LDL was 65, HDL was 85, cholesterol was 165, triglycerides were 79; TSH was 2.740.  Patient stable and physical normal. She had a bit of a memory impairment with short term memory, she scores 2/3 in the Medicare Questions. Continue to monitor her memory issues. She is independent with all physical activity. She sustained a fall on 01/17/2023, and she injured her right wrist de to a dizzy spell and loss of balance. Preventive Health Care Recommendations were given as below. Patient verbalized understanding of the results and the plan.    Abnormal urine odor  Acute. Uncontrolled  Ordered urine dip and urine culture    Chronic insomnia  Chronic. Controlled. Stable  She takes zolpidem 12.5 mg, 1 tablet by mouth at bedtime. Needs a refill.  A refill was provided today     Memory disturbance   Acute finding on today's 3 word registration questions. Patient only able to remember 2 out of the 3 words that were given to her after 5 minute recall. In addition, when speaking to patient today, she seemed a bit off and was formulating sentences that did not necessarily connect with each topic. In addition, she could not tell me which medications she was taking for sure or not and would later tell me she was on the medications that she told me she was not taking. I am very concerned about this patient's memory and feel that she could be unsafe to herself as well as the public while driving. I am going to try to contact her Neurologist Dr. Izora Ribas at [facility] treating her for Parkinsonism as well as place a Care Management referral for her for evaluation of her memory/safety.    Myofascial muscle pain  Chronic.  Controlled. Stable  She takes tizanidine 4 mg, 1 tablet by mouth twice a day daily and Cymbalta 60 mg, 1 capsule by mouth daily for muscle pain. Need refills.  Refills were provided today     Chronic idiopathic constipation  Chronic. Uncontrolled  Started patient on Senna 8.6 mg, 1-2 capsules daily PRN    Depression with anxiety   Chronic. Controlled. Stable  Continue Cymbalta 60 mg daily and Lorazepam 0.5 mg q6h PRN anxiety.    Essential hypertension   Chronic. Controlled. Stable  She takes amlodipine 5 mg daily, metoprolol succinate 50 mg extended-release 1 tablet by mouth daily, and doxazosin 2 mg, 1 tablet by mouth daily as needed. Needs a refill for doxazosin.   A refill was provided today     At high risk for falls  Referred patient to fall risk program at [facility]    Need for influenza vaccination  Due for influenza vaccination.   Counseled the patient on the risks vs benefits of the vaccination. Patient wished to proceed and tolerated procedure well without any immediate complications.  Administered influenza vaccine today.    Screening for osteoporosis  Ordered DEXA scan     Recommended to follow up with the neurologist regarding possible medication to help stabilize the memory and also for prurigo.  Advised to follow up with Dermatology    Documentation assistance provided by Carney Living, scribing for Melba Coon DO on 02/26/2023. The patient and family/friends if present were apprised of the use of a scribe through remote viewing and all parties  consented to conducting the visit in this manner.      I, Melba Coon DO, personally reviewed the patient's note and all instructions on 02/26/2023 and agree with the documentation of my scribe.    No follow-ups on file.      Electronically signed by Melba Coon, DO at 03/09/2023 12:49 PM EDT

## 2023-03-04 NOTE — Telephone Encounter (Signed)
Formatting of this note might be different from the original.  error  Electronically signed by Rica Mast, Registered Nurse at 03/04/2023  1:28 PM EDT

## 2023-03-06 NOTE — Telephone Encounter (Signed)
Formatting of this note might be different from the original.  Patient calling about results of urine culture from 10/1 and next steps. Please advise.   Electronically signed by Rica Mast, Registered Nurse at 03/06/2023 10:39 AM EDT

## 2023-03-08 NOTE — Telephone Encounter (Signed)
Formatting of this note might be different from the original.  During my recent Medicare Annual Wellness Visit, patient was displaying memory issues and could not recall which medications she was on, truly taking, or not taking. She seemed somewhat confused during her office visit with me and I was afraid she might not remember everything we had gone over with her at the office visit.   Later, she reported she was still taking medications that she told me she was not.   She scored 2/3 on the 5 minute recall questions.  She is seeing Dr. Izora Ribas at [facility] for Parkinsonism treatment.   Please, could you call his office and just let the staff know to inform Dr. Yevonne Aline about this patient's memory being a concern: a concern for her safety and the safety of the public while she is driving.   Thanks,  ~Dr. Evelene Croon  Electronically signed by Melba Coon, DO at 03/08/2023  5:34 PM EDT

## 2023-03-09 NOTE — Telephone Encounter (Signed)
Formatting of this note might be different from the original.  03/09/2023    Fayne Mediate, 67 year old, female, sent a message regarding the results of her urine test.    Informed patient of physician message and instructions. Patient verbalized understanding and has no questions or concerns at this time.     Electronically signed by Lesly Rubenstein, LPN at 16/03/9603 10:22 AM EDT

## 2023-03-10 NOTE — Telephone Encounter (Signed)
 Formatting of this note might be different from the original.  Incoming Call    The patient is calling office today and can be reached at: [phone]     Involvement in Care Reviewed: no    Situation  Patient calling regards to urine culture. Explained results to patient and gave instructions for Bactrim that was prescribed.    Patient also feels she is dehydrated and said PCP had mentioned during appointment that blood work from physical on 02/26/2023 showed she may be dehydrated. Patient asking which tests specifically showed dehydration and if you had any further recommendations besides increasing fluid intake.    Updated Preferred Pharmacy: no    Please advise and call patient with response.     Electronically signed by Judi Cong at 03/10/2023  3:36 PM EDT

## 2023-03-10 NOTE — Telephone Encounter (Signed)
Formatting of this note might be different from the original.  Spoke with Paula Compton from [employer] about patient's memory.    Patient was recently seen in September and did not display any memory issues. Noted patient had positive urine culture on 10/1.     Paula Compton will make Dr. Yevonne Aline aware.   Electronically signed by Rica Mast, Registered Nurse at 03/10/2023 10:33 AM EDT

## 2023-03-10 NOTE — Telephone Encounter (Signed)
Formatting of this note might be different from the original.  Incoming Call    The patient is calling office today and can be reached at: [phone]     Involvement in Care Reviewed: no    Situation  Patient calling regards to urine culture. Explained results to patient and gave instructions for Bactrim that was prescribed.    Patient also feels she is dehydrated and said PCP had mentioned during appointment that blood work from physical on 02/26/2023 showed she may be dehydrated. Patient asking which tests specifically showed dehydration and if you had any further recommendations besides increasing fluid intake.    Updated Preferred Pharmacy: no    Please advise and call patient with response.     Electronically signed by Judi Cong at 03/10/2023  3:36 PM EDT

## 2023-03-13 NOTE — Telephone Encounter (Signed)
Formatting of this note might be different from the original.  Attempted to reach patient at: [phone] (home) [phone] [facility])     No answer. Per current Involvement in Care, left detailed message informing patient of physician instructions. Instructed patient to return call with questions or concerns.     Electronically signed by Judi Cong at 03/13/2023  4:32 PM EDT

## 2023-03-13 NOTE — Telephone Encounter (Signed)
Formatting of this note might be different from the original.  Pt has 2 more days of Bactrim.  She asked if she could leave a urine sample with [place] to ensure the UTI is cleared up.     Symptoms today:  Patient does not have any acute symptoms today.  She said her urine is not burning, no odor, urine is white yellow.      Advised PT of Dr Carie Caddy instruction on hydration listed below.  Pt advised she will create the oral rehydration solution and will take small sips through out the day.     Pt asked for a call back [phone]  Electronically signed by Dorisann Frames at 03/13/2023  9:33 AM EDT

## 2023-03-13 NOTE — Telephone Encounter (Signed)
 Formatting of this note might be different from the original.  Pt has 2 more days of Bactrim.  She asked if she could leave a urine sample with [place] to ensure the UTI is cleared up.     Symptoms today:  Patient does not have any acute symptoms today.  She said her urine is not burning, no odor, urine is white yellow.      Advised PT of Dr Carie Caddy instruction on hydration listed below.  Pt advised she will create the oral rehydration solution and will take small sips through out the day.     Pt asked for a call back [phone]  Electronically signed by Dorisann Frames at 03/13/2023  9:33 AM EDT

## 2023-03-13 NOTE — Telephone Encounter (Signed)
 Formatting of this note might be different from the original.  Attempted to reach patient at: [phone] (home) [phone] [facility])     No answer. Per current Involvement in Care, left detailed message informing patient of physician instructions. Instructed patient to return call with questions or concerns.     Electronically signed by Judi Cong at 03/13/2023  4:32 PM EDT

## 2023-03-17 NOTE — Telephone Encounter (Signed)
Formatting of this note might be different from the original.  INCOMING CALL    Patient contacted office at this time 8:13AM    Patient left a urine sample yesterday 03/16/23.  Pt asked if Dr Evelene Croon can review and advise if the UTI infection is cleared.     Please Advise    Best Call Back   [phone] (home) [phone] [facility])    Electronically signed by Dorisann Frames at 03/17/2023  8:15 AM EDT

## 2023-03-17 NOTE — Telephone Encounter (Signed)
 Formatting of this note might be different from the original.  INCOMING CALL    Patient contacted office at this time 8:13AM    Patient left a urine sample yesterday 03/16/23.  Pt asked if Dr Evelene Croon can review and advise if the UTI infection is cleared.     Please Advise    Best Call Back   [phone] (home) [phone] [facility])    Electronically signed by Dorisann Frames at 03/17/2023  8:15 AM EDT

## 2023-03-17 NOTE — Telephone Encounter (Signed)
Formatting of this note might be different from the original.  Pt stated she is not having any symptoms of a UTI today. No burning, urine clear, no other symptoms  Electronically signed by Dorisann Frames at 03/17/2023  3:27 PM EDT

## 2023-03-17 NOTE — Telephone Encounter (Signed)
 Formatting of this note might be different from the original.  Pt stated she is not having any symptoms of a UTI today. No burning, urine clear, no other symptoms  Electronically signed by Dorisann Frames at 03/17/2023  3:27 PM EDT

## 2023-03-18 NOTE — Telephone Encounter (Signed)
Formatting of this note might be different from the original.  Patient called this office requesting update on her UA tesst results  Informed patient of physician message and instructions. Patient verbalized understanding  Electronically signed by Lesly Rubenstein, LPN at 63/87/5643 11:07 AM EDT

## 2023-03-18 NOTE — Telephone Encounter (Signed)
 Formatting of this note might be different from the original.  Patient called this office requesting update on her UA tesst results  Informed patient of physician message and instructions. Patient verbalized understanding  Electronically signed by Lesly Rubenstein, LPN at 63/87/5643 11:07 AM EDT

## 2023-03-20 NOTE — Telephone Encounter (Signed)
 Formatting of this note might be different from the original.  Situation:Sw placed call today for purpose of: Referral from Dr. Evelene Croon for assessment and Psychologist, sport and exercise.    Spoke with: Patient     Background: Patient lives with her spouse in [place]. She drives her own vehicle for transportation.     Assessment: Patient is presenting as alert and oriented x 4. She has access to family support and transportation. She is voicing no problems with affording food or medications. She feels she has the DME and resources she needs to be safe at home with no interventions.     Sw addressed medication management and driving safety with patient today. Patient voices she feels she is managing her medications well, with no problems. Sw provided some education today about the Public librarian OT Evaluation program at the [facility].     Recommendation:  Patient declines the need for any social work interventions at this time. Patient/responsible party has the contact information for this social worker, should any additional needs arise.    Boris Lown, Rolene Course, MSW  Social Worker for [employer]   Phone: [phone]  E-fax: [phone]  [email or web address]   Electronically signed by Marvene Staff, LSW at 03/20/2023  3:56 PM EDT

## 2023-03-20 NOTE — Telephone Encounter (Signed)
Formatting of this note might be different from the original.  Situation:Sw placed call today for purpose of: Referral from Dr. Evelene Croon for assessment and Psychologist, sport and exercise.    Spoke with: Patient     Background: Patient lives with her spouse in [place]. She drives her own vehicle for transportation.     Assessment: Patient is presenting as alert and oriented x 4. She has access to family support and transportation. She is voicing no problems with affording food or medications. She feels she has the DME and resources she needs to be safe at home with no interventions.     Sw addressed medication management and driving safety with patient today. Patient voices she feels she is managing her medications well, with no problems. Sw provided some education today about the Public librarian OT Evaluation program at the [facility].     Recommendation:  Patient declines the need for any social work interventions at this time. Patient/responsible party has the contact information for this social worker, should any additional needs arise.    Boris Lown, Rolene Course, MSW  Social Worker for [employer] [facility]   Phone: [phone]  E-fax: [phone]  [email or web address]   Electronically signed by Marvene Staff, LSW at 03/20/2023  3:56 PM EDT

## 2023-04-17 ENCOUNTER — Inpatient Hospital Stay: Admit: 2023-04-17 | Payer: Medicare (Managed Care) | Attending: Neurology

## 2023-04-17 ENCOUNTER — Inpatient Hospital Stay: Admit: 2023-04-17 | Discharge: 2023-04-21 | Payer: Medicare (Managed Care) | Attending: Neurology

## 2023-04-17 DIAGNOSIS — G20C Parkinsonism, unspecified (CMS-HCC): Secondary | ICD-10-CM

## 2023-04-17 MED ORDER — I-123 ioflupane (DaTscan) 4.9 millicurie
5 | Freq: Once | INTRAVENOUS | Status: AC | PRN
Start: 2023-04-17 — End: 2023-04-17
  Administered 2023-04-17: 15:00:00 via INTRAVENOUS

## 2023-04-17 MED ORDER — potassium iodide (SSKI) 1 gram/mL solution 0.5 g
1 | Freq: Once | ORAL | Status: AC
Start: 2023-04-17 — End: 2023-04-17
  Administered 2023-04-17: 14:00:00 via ORAL
